# Patient Record
Sex: Male | Born: 1955 | Race: Black or African American | Hispanic: No | Marital: Single | Smoking: Current every day smoker
Health system: Southern US, Community
[De-identification: ages and names within clinical notes are randomized; demographics above are authoritative.]

## PROBLEM LIST (undated history)

## (undated) DIAGNOSIS — E119 Type 2 diabetes mellitus without complications: Secondary | ICD-10-CM

## (undated) DIAGNOSIS — I4891 Unspecified atrial fibrillation: Secondary | ICD-10-CM

## (undated) DIAGNOSIS — I10 Essential (primary) hypertension: Secondary | ICD-10-CM

## (undated) DIAGNOSIS — I219 Acute myocardial infarction, unspecified: Secondary | ICD-10-CM

## (undated) DIAGNOSIS — I639 Cerebral infarction, unspecified: Secondary | ICD-10-CM

---

## 2019-09-18 ENCOUNTER — Emergency Department (HOSPITAL_COMMUNITY): Payer: No Typology Code available for payment source

## 2019-09-18 ENCOUNTER — Observation Stay (HOSPITAL_COMMUNITY)
Admission: EM | Admit: 2019-09-18 | Discharge: 2019-09-20 | Disposition: A | Discharge status: Home / Self Care | Payer: No Typology Code available for payment source | Attending: Internal Medicine | Admitting: Internal Medicine

## 2019-09-18 ENCOUNTER — Other Ambulatory Visit: Payer: Self-pay

## 2019-09-18 ENCOUNTER — Encounter (HOSPITAL_COMMUNITY): Payer: Self-pay

## 2019-09-18 DIAGNOSIS — I2510 Atherosclerotic heart disease of native coronary artery without angina pectoris: Secondary | ICD-10-CM | POA: Diagnosis not present

## 2019-09-18 DIAGNOSIS — R079 Chest pain, unspecified: Principal | ICD-10-CM | POA: Diagnosis present

## 2019-09-18 DIAGNOSIS — G8929 Other chronic pain: Secondary | ICD-10-CM | POA: Insufficient documentation

## 2019-09-18 DIAGNOSIS — R0602 Shortness of breath: Secondary | ICD-10-CM

## 2019-09-18 DIAGNOSIS — R0902 Hypoxemia: Secondary | ICD-10-CM | POA: Insufficient documentation

## 2019-09-18 DIAGNOSIS — Z20822 Contact with and (suspected) exposure to covid-19: Secondary | ICD-10-CM | POA: Diagnosis not present

## 2019-09-18 DIAGNOSIS — M549 Dorsalgia, unspecified: Secondary | ICD-10-CM | POA: Insufficient documentation

## 2019-09-18 DIAGNOSIS — D696 Thrombocytopenia, unspecified: Secondary | ICD-10-CM | POA: Diagnosis not present

## 2019-09-18 DIAGNOSIS — E119 Type 2 diabetes mellitus without complications: Secondary | ICD-10-CM | POA: Insufficient documentation

## 2019-09-18 DIAGNOSIS — R400 Somnolence: Secondary | ICD-10-CM | POA: Diagnosis not present

## 2019-09-18 DIAGNOSIS — I252 Old myocardial infarction: Secondary | ICD-10-CM | POA: Diagnosis not present

## 2019-09-18 DIAGNOSIS — I4891 Unspecified atrial fibrillation: Secondary | ICD-10-CM

## 2019-09-18 DIAGNOSIS — E669 Obesity, unspecified: Secondary | ICD-10-CM | POA: Insufficient documentation

## 2019-09-18 DIAGNOSIS — Z6835 Body mass index (BMI) 35.0-35.9, adult: Secondary | ICD-10-CM | POA: Insufficient documentation

## 2019-09-18 DIAGNOSIS — I639 Cerebral infarction, unspecified: Secondary | ICD-10-CM

## 2019-09-18 DIAGNOSIS — Z794 Long term (current) use of insulin: Secondary | ICD-10-CM

## 2019-09-18 DIAGNOSIS — F17210 Nicotine dependence, cigarettes, uncomplicated: Secondary | ICD-10-CM | POA: Diagnosis not present

## 2019-09-18 DIAGNOSIS — I69320 Aphasia following cerebral infarction: Secondary | ICD-10-CM | POA: Insufficient documentation

## 2019-09-18 DIAGNOSIS — D509 Iron deficiency anemia, unspecified: Secondary | ICD-10-CM | POA: Diagnosis not present

## 2019-09-18 DIAGNOSIS — I480 Paroxysmal atrial fibrillation: Secondary | ICD-10-CM | POA: Diagnosis not present

## 2019-09-18 DIAGNOSIS — I10 Essential (primary) hypertension: Secondary | ICD-10-CM | POA: Diagnosis not present

## 2019-09-18 DIAGNOSIS — R451 Restlessness and agitation: Secondary | ICD-10-CM | POA: Diagnosis not present

## 2019-09-18 HISTORY — DX: Type 2 diabetes mellitus without complications: E11.9

## 2019-09-18 HISTORY — DX: Acute myocardial infarction, unspecified: I21.9

## 2019-09-18 HISTORY — DX: Cerebral infarction, unspecified: I63.9

## 2019-09-18 HISTORY — DX: Essential (primary) hypertension: I10

## 2019-09-18 HISTORY — DX: Unspecified atrial fibrillation: I48.91

## 2019-09-18 LAB — CBC WITH DIFFERENTIAL/PLATELET
Abs Immature Granulocytes: 0.02 10*3/uL (ref 0.00–0.07)
Basophils Absolute: 0 10*3/uL (ref 0.0–0.1)
Basophils Relative: 0 %
Eosinophils Absolute: 0.3 10*3/uL (ref 0.0–0.5)
Eosinophils Relative: 3 %
HCT: 31 % — ABNORMAL LOW (ref 39.0–52.0)
Hemoglobin: 10.7 g/dL — ABNORMAL LOW (ref 13.0–17.0)
Immature Granulocytes: 0 %
Lymphocytes Relative: 22 %
Lymphs Abs: 2.2 10*3/uL (ref 0.7–4.0)
MCH: 24.2 pg — ABNORMAL LOW (ref 26.0–34.0)
MCHC: 34.5 g/dL (ref 30.0–36.0)
MCV: 70 fL — ABNORMAL LOW (ref 80.0–100.0)
Monocytes Absolute: 0.7 10*3/uL (ref 0.1–1.0)
Monocytes Relative: 7 %
Neutro Abs: 6.9 10*3/uL (ref 1.7–7.7)
Neutrophils Relative %: 68 %
Platelets: 157 10*3/uL (ref 150–400)
RBC: 4.43 MIL/uL (ref 4.22–5.81)
RDW: 16.5 % — ABNORMAL HIGH (ref 11.5–15.5)
WBC: 10.1 10*3/uL (ref 4.0–10.5)
nRBC: 0.2 % (ref 0.0–0.2)

## 2019-09-18 NOTE — ED Triage Notes (Signed)
Pt states he started having chest "pressure" around 2130 today with diaphoresis and SOB. Pt states he had an MI last Thursday and it feels the same.

## 2019-09-18 NOTE — ED Provider Notes (Signed)
[facility] DEPT Provider Note: Lowella Dell, MD, FACEP  CSN: [id number] MRN: [id number] ARRIVAL: 09/18/19 at 2247 ROOM: 1401/1401-01   CHIEF COMPLAINT  Chest Pain   HISTORY OF PRESENT ILLNESS  09/18/19 11:13 PM Eric Forbes is a 64 y.o. male who states that 9 days ago he had a heart attack and was seen in an ED in [facility], he also states he was seen at [facility] in [place].  [No records of a visit by this patient could be found using Epic outride record search.]  He characterizes the event as consisting of the sensation of an elephant sitting on his chest, shortness of breath and diaphoresis but no nausea.  He states a cardiac catheterization was performed through his right radial artery but no stenting was done.  He is here with similar symptoms that occurred earlier this evening but he cannot say exactly when.  He again had chest pain that has now moved down to his lower abdomen.  He had shortness of breath that has resolved he had diaphoresis that has resolved.  He did not have nausea.  He currently rates his discomfort as a 5 out of 10 but is reticent to call in an actual pain at the present time.  He states earlier he was afraid to take a deep breath for fear of dying.  The patient had a stroke about a year ago due to atrial fibrillation (he is now status post ablation) and has a resulting mild expressive aphasia.  He is also on Dayvigo currently.  His nurse reports his oxygen saturation was dropping into the 70s so she placed him on oxygen and his oxygen saturation is now 99%.   Past Medical History:  Diagnosis Date  . Afib (HCC)   . Diabetes mellitus without complication (HCC)   . Hypertension   . Myocardial infarction (HCC)   . Stroke [facility])     History reviewed. No pertinent surgical history.  History reviewed. No pertinent family history.  Social History   Tobacco Use  . Smoking status: Current Every Day Smoker   Packs/day: 0.50    Types: Cigarettes  . Smokeless tobacco: Never Used  Substance Use Topics  . Alcohol use: Yes    Comment: socially  . Drug use: Never    Prior to Admission medications   Not on File    Allergies Patient has no known allergies.   REVIEW OF SYSTEMS  Negative except as noted here or in the History of Present Illness.   PHYSICAL EXAMINATION  Initial Vital Signs Blood pressure (!) 144/84, pulse 79, temperature 98 F (36.7 C), resp. rate 16, height 5' 9.5" (1.765 m), weight 108.9 kg, SpO2 95 %.  Examination General: Well-developed, well-nourished male in no acute distress; appearance consistent with age of record HENT: normocephalic; atraumatic Eyes: pupils equal, round and reactive to light; extraocular muscles intact Neck: supple Heart: regular rate and rhythm Lungs: clear to auscultation bilaterally Abdomen: soft; nondistended; mild diffuse tenderness; no masses or hepatosplenomegaly; bowel sounds present Extremities: No deformity; full range of motion; pulses normal Neurologic: Awake, alert and oriented; motor function intact in all extremities and symmetric; no facial droop; occasional stammer; one episode of what appeared to be a narcoleptic episode midsentence, readily aroused Skin: Warm and dry; healing puncture wound overlying right radial artery at the wrist Psychiatric: Normal mood and affect   RESULTS  Summary of this visit's results, reviewed and interpreted by myself:  EKG Interpretation:  Date &  Time: 09/18/2019 10:54 PM  Rate: 76  Rhythm: normal sinus rhythm  QRS Axis: normal  Intervals: normal  ST/T Wave abnormalities: normal  Conduction Disutrbances:none  Narrative Interpretation:   Old EKG Reviewed: none available  Laboratory Studies: Results for orders placed or performed during the hospital encounter of 09/18/19 (from the past 24 hour(s))  CBC with Differential/Platelet     Status: Abnormal   Collection Time: 09/18/19 11:33 PM    Result Value Ref Range   WBC 10.1 4.0 - 10.5 K/uL   RBC 4.43 4.22 - 5.81 MIL/uL   Hemoglobin 10.7 (L) 13.0 - 17.0 g/dL   HCT 31.0 (L) 39.0 - 52.0 %   MCV 70.0 (L) 80.0 - 100.0 fL   MCH 24.2 (L) 26.0 - 34.0 pg   MCHC 34.5 30.0 - 36.0 g/dL   RDW 16.5 (H) 11.5 - 15.5 %   Platelets 157 150 - 400 K/uL   nRBC 0.2 0.0 - 0.2 %   Neutrophils Relative % 68 %   Neutro Abs 6.9 1.7 - 7.7 K/uL   Lymphocytes Relative 22 %   Lymphs Abs 2.2 0.7 - 4.0 K/uL   Monocytes Relative 7 %   Monocytes Absolute 0.7 0.1 - 1.0 K/uL   Eosinophils Relative 3 %   Eosinophils Absolute 0.3 0.0 - 0.5 K/uL   Basophils Relative 0 %   Basophils Absolute 0.0 0.0 - 0.1 K/uL   Immature Granulocytes 0 %   Abs Immature Granulocytes 0.02 0.00 - 0.07 K/uL  Basic metabolic panel     Status: None   Collection Time: 09/18/19 11:33 PM  Result Value Ref Range   Sodium 139 135 - 145 mmol/L   Potassium 4.2 3.5 - 5.1 mmol/L   Chloride 98 98 - 111 mmol/L   CO2 31 22 - 32 mmol/L   Glucose, Bld 98 70 - 99 mg/dL   BUN 23 8 - 23 mg/dL   Creatinine, Ser 1.04 0.61 - 1.24 mg/dL   Calcium 9.8 8.9 - 10.3 mg/dL   GFR calc non Af Amer >60 >60 mL/min   GFR calc Af Amer >60 >60 mL/min   Anion gap 10 5 - 15  Troponin I (High Sensitivity)     Status: None   Collection Time: 09/18/19 11:33 PM  Result Value Ref Range   Troponin I (High Sensitivity) 5 <18 ng/L  Troponin I (High Sensitivity)     Status: None   Collection Time: 09/19/19  1:05 AM  Result Value Ref Range   Troponin I (High Sensitivity) 6 <18 ng/L  Respiratory Panel by RT PCR (Flu A&B, Covid) - Nasopharyngeal Swab     Status: None   Collection Time: 09/19/19  1:47 AM   Specimen: Nasopharyngeal Swab  Result Value Ref Range   SARS Coronavirus 2 by RT PCR NEGATIVE NEGATIVE   Influenza A by PCR NEGATIVE NEGATIVE   Influenza B by PCR NEGATIVE NEGATIVE  Iron and TIBC     Status: None   Collection Time: 09/19/19  4:46 AM  Result Value Ref Range   Iron 87 45 - 182 ug/dL   TIBC  377 250 - 450 ug/dL   Saturation Ratios 23 17.9 - 39.5 %   UIBC 290 ug/dL  Ferritin     Status: None   Collection Time: 09/19/19  4:46 AM  Result Value Ref Range   Ferritin 172 24 - 336 ng/mL  D-dimer, quantitative (not at [facility])     Status: None   Collection Time: 09/19/19  4:46 AM  Result Value Ref Range   D-Dimer, Quant <0.27 0.00 - 0.50 ug/mL-FEU   Imaging Studies: DG Chest 2 View  Result Date: 09/18/2019 CLINICAL DATA:  Chest pressure and shortness of breath. EXAM: CHEST - 2 VIEW COMPARISON:  None. FINDINGS: The heart size and mediastinal contours are within normal limits. Both lungs are clear. The visualized skeletal structures are unremarkable. IMPRESSION: No active cardiopulmonary disease. Electronically Signed   By: Aram Candela M.D.   On: 09/18/2019 23:52    ED COURSE and MDM  Nursing notes, initial and subsequent vitals signs, including pulse oximetry, reviewed and interpreted by myself.  Vitals:   09/19/19 0130 09/19/19 0200 09/19/19 0230 09/19/19 0308  BP:  (!) 130/91  106/76  Pulse: 73 81 82 69  Resp: 14 18 20 18   Temp:    98.1 F (36.7 C)  TempSrc:    Oral  SpO2: 97% 94% 98% 100%  Weight:    108.9 kg  Height:    5\' 9"  (1.753 m)   Medications  acetaminophen (TYLENOL) tablet 650 mg (has no administration in time range)  nicotine (NICODERM CQ - dosed in mg/24 hours) patch 21 mg (has no administration in time range)  insulin aspart (novoLOG) injection 0-9 Units (has no administration in time range)  insulin aspart (novoLOG) injection 0-5 Units (has no administration in time range)  HYDROcodone-acetaminophen (NORCO) 10-325 MG per tablet 1 tablet (1 tablet Oral Given 09/19/19 0355)    1:45 AM Patient denies chest pain at this time.  Troponins negative.  Hospitalist to admit.  Unable to obtain records regarding patient's reported recent hospitalization for MI.  PROCEDURES  Procedures   ED DIAGNOSES     ICD-10-CM   1. Chest pain at rest  R07.9          , MD 09/19/19 [phone]

## 2019-09-19 ENCOUNTER — Other Ambulatory Visit: Payer: Self-pay

## 2019-09-19 ENCOUNTER — Encounter (HOSPITAL_COMMUNITY): Payer: Self-pay | Admitting: Internal Medicine

## 2019-09-19 ENCOUNTER — Observation Stay (HOSPITAL_BASED_OUTPATIENT_CLINIC_OR_DEPARTMENT_OTHER): Payer: No Typology Code available for payment source

## 2019-09-19 DIAGNOSIS — I639 Cerebral infarction, unspecified: Secondary | ICD-10-CM | POA: Diagnosis not present

## 2019-09-19 DIAGNOSIS — I480 Paroxysmal atrial fibrillation: Secondary | ICD-10-CM | POA: Diagnosis not present

## 2019-09-19 DIAGNOSIS — Z720 Tobacco use: Secondary | ICD-10-CM

## 2019-09-19 DIAGNOSIS — R079 Chest pain, unspecified: Secondary | ICD-10-CM

## 2019-09-19 DIAGNOSIS — I10 Essential (primary) hypertension: Secondary | ICD-10-CM | POA: Diagnosis not present

## 2019-09-19 DIAGNOSIS — R451 Restlessness and agitation: Secondary | ICD-10-CM

## 2019-09-19 DIAGNOSIS — D509 Iron deficiency anemia, unspecified: Secondary | ICD-10-CM | POA: Diagnosis not present

## 2019-09-19 DIAGNOSIS — I2510 Atherosclerotic heart disease of native coronary artery without angina pectoris: Secondary | ICD-10-CM

## 2019-09-19 DIAGNOSIS — G8929 Other chronic pain: Secondary | ICD-10-CM | POA: Diagnosis not present

## 2019-09-19 DIAGNOSIS — R400 Somnolence: Secondary | ICD-10-CM

## 2019-09-19 DIAGNOSIS — E119 Type 2 diabetes mellitus without complications: Secondary | ICD-10-CM | POA: Diagnosis not present

## 2019-09-19 DIAGNOSIS — I4891 Unspecified atrial fibrillation: Secondary | ICD-10-CM

## 2019-09-19 LAB — CBC WITH DIFFERENTIAL/PLATELET
Abs Immature Granulocytes: 0.01 10*3/uL (ref 0.00–0.07)
Basophils Absolute: 0 10*3/uL (ref 0.0–0.1)
Basophils Relative: 0 %
Eosinophils Absolute: 0.3 10*3/uL (ref 0.0–0.5)
Eosinophils Relative: 3 %
HCT: 29.9 % — ABNORMAL LOW (ref 39.0–52.0)
Hemoglobin: 10.4 g/dL — ABNORMAL LOW (ref 13.0–17.0)
Immature Granulocytes: 0 %
Lymphocytes Relative: 22 %
Lymphs Abs: 1.6 10*3/uL (ref 0.7–4.0)
MCH: 24.2 pg — ABNORMAL LOW (ref 26.0–34.0)
MCHC: 34.8 g/dL (ref 30.0–36.0)
MCV: 69.7 fL — ABNORMAL LOW (ref 80.0–100.0)
Monocytes Absolute: 0.6 10*3/uL (ref 0.1–1.0)
Monocytes Relative: 9 %
Neutro Abs: 5 10*3/uL (ref 1.7–7.7)
Neutrophils Relative %: 66 %
Platelets: 134 10*3/uL — ABNORMAL LOW (ref 150–400)
RBC: 4.29 MIL/uL (ref 4.22–5.81)
RDW: 16.5 % — ABNORMAL HIGH (ref 11.5–15.5)
WBC: 7.5 10*3/uL (ref 4.0–10.5)
nRBC: 0.4 % — ABNORMAL HIGH (ref 0.0–0.2)

## 2019-09-19 LAB — BASIC METABOLIC PANEL
Anion gap: 10 (ref 5–15)
BUN: 23 mg/dL (ref 8–23)
CO2: 31 mmol/L (ref 22–32)
Calcium: 9.8 mg/dL (ref 8.9–10.3)
Chloride: 98 mmol/L (ref 98–111)
Creatinine, Ser: 1.04 mg/dL (ref 0.61–1.24)
GFR calc Af Amer: >60 mL/min (ref >60)
GFR calc non Af Amer: >60 mL/min (ref >60)
Glucose, Bld: 98 mg/dL (ref 70–99)
Potassium: 4.2 mmol/L (ref 3.5–5.1)
Sodium: 139 mmol/L (ref 135–145)

## 2019-09-19 LAB — BLOOD GAS, ARTERIAL
Acid-Base Excess: 7.3 mmol/L — ABNORMAL HIGH (ref 0.0–2.0)
Bicarbonate: 32.5 mmol/L — ABNORMAL HIGH (ref 20.0–28.0)
FIO2: 21
O2 Saturation: 92.4 %
Patient temperature: 98.6
pCO2 arterial: 51.4 mmHg — ABNORMAL HIGH (ref 32.0–48.0)
pH, Arterial: 7.417 (ref 7.350–7.450)
pO2, Arterial: 71.6 mmHg — ABNORMAL LOW (ref 83.0–108.0)

## 2019-09-19 LAB — COMPREHENSIVE METABOLIC PANEL
ALT: 17 U/L (ref 0–44)
AST: 19 U/L (ref 15–41)
Albumin: 3.8 g/dL (ref 3.5–5.0)
Alkaline Phosphatase: 72 U/L (ref 38–126)
Anion gap: 7 (ref 5–15)
BUN: 21 mg/dL (ref 8–23)
CO2: 31 mmol/L (ref 22–32)
Calcium: 9 mg/dL (ref 8.9–10.3)
Chloride: 103 mmol/L (ref 98–111)
Creatinine, Ser: 0.74 mg/dL (ref 0.61–1.24)
GFR calc Af Amer: >60 mL/min (ref >60)
GFR calc non Af Amer: >60 mL/min (ref >60)
Glucose, Bld: 120 mg/dL — ABNORMAL HIGH (ref 70–99)
Potassium: 4.2 mmol/L (ref 3.5–5.1)
Sodium: 141 mmol/L (ref 135–145)
Total Bilirubin: 0.7 mg/dL (ref 0.3–1.2)
Total Protein: 6.8 g/dL (ref 6.5–8.1)

## 2019-09-19 LAB — GLUCOSE, CAPILLARY
Glucose-Capillary: 114 mg/dL — ABNORMAL HIGH (ref 70–99)
Glucose-Capillary: 242 mg/dL — ABNORMAL HIGH (ref 70–99)
Glucose-Capillary: 190 mg/dL — ABNORMAL HIGH (ref 70–99)
Glucose-Capillary: 217 mg/dL — ABNORMAL HIGH (ref 70–99)

## 2019-09-19 LAB — MAGNESIUM: Magnesium: 2.1 mg/dL (ref 1.7–2.4)

## 2019-09-19 LAB — IRON AND TIBC
Iron: 87 ug/dL (ref 45–182)
Saturation Ratios: 23 % (ref 17.9–39.5)
TIBC: 377 ug/dL (ref 250–450)
UIBC: 290 ug/dL

## 2019-09-19 LAB — RESPIRATORY PANEL BY RT PCR (FLU A&B, COVID)
Influenza A by PCR: NEGATIVE
Influenza B by PCR: NEGATIVE
SARS Coronavirus 2 by RT PCR: NEGATIVE

## 2019-09-19 LAB — RAPID URINE DRUG SCREEN, HOSP PERFORMED
Amphetamines: NOT DETECTED
Barbiturates: NOT DETECTED
Benzodiazepines: NOT DETECTED
Cocaine: NOT DETECTED
Opiates: POSITIVE — AB
Tetrahydrocannabinol: NOT DETECTED

## 2019-09-19 LAB — TROPONIN I (HIGH SENSITIVITY)
Troponin I (High Sensitivity): 5 ng/L (ref <18)
Troponin I (High Sensitivity): 6 ng/L (ref <18)

## 2019-09-19 LAB — TSH: TSH: 0.766 u[IU]/mL (ref 0.350–4.500)

## 2019-09-19 LAB — PHOSPHORUS: Phosphorus: 4.3 mg/dL (ref 2.5–4.6)

## 2019-09-19 LAB — FERRITIN: Ferritin: 172 ng/mL (ref 24–336)

## 2019-09-19 LAB — HIV ANTIBODY (ROUTINE TESTING W REFLEX): HIV Screen 4th Generation wRfx: NONREACTIVE

## 2019-09-19 LAB — ECHOCARDIOGRAM COMPLETE
Height: 69 in
Weight: 3840 oz

## 2019-09-19 LAB — D-DIMER, QUANTITATIVE: D-Dimer, Quant: <0.27 ug/mL-FEU (ref 0.00–0.50)

## 2019-09-19 MED ORDER — HYDROCODONE-ACETAMINOPHEN 10-325 MG PO TABS
1.0000 | ORAL_TABLET | Freq: Four times a day (QID) | ORAL | Status: DC | PRN
  Administered 2019-09-19 – 2019-09-20 (×4): 1 via ORAL
  Filled 2019-09-19 (×4): qty 1

## 2019-09-19 MED ORDER — INSULIN ASPART 100 UNIT/ML ~~LOC~~ SOLN
0.0000 [IU] | Freq: Three times a day (TID) | SUBCUTANEOUS | Status: DC
  Administered 2019-09-19: 2 [IU] via SUBCUTANEOUS
  Administered 2019-09-19: 3 [IU] via SUBCUTANEOUS
  Administered 2019-09-20: 2 [IU] via SUBCUTANEOUS

## 2019-09-19 MED ORDER — NITROGLYCERIN 0.4 MG SL SUBL: 0.4000 mg | SUBLINGUAL_TABLET | SUBLINGUAL | Status: DC | PRN

## 2019-09-19 MED ORDER — ENOXAPARIN SODIUM 40 MG/0.4ML ~~LOC~~ SOLN: 40.0000 mg | SUBCUTANEOUS | Status: DC

## 2019-09-19 MED ORDER — HYDROCODONE-ACETAMINOPHEN 5-325 MG PO TABS: 1.0000 | ORAL_TABLET | Freq: Four times a day (QID) | ORAL | Status: DC | PRN

## 2019-09-19 MED ORDER — NITROGLYCERIN 0.4 MG SL SUBL: 0.4000 mg | SUBLINGUAL_TABLET | SUBLINGUAL | 0 refills | Status: DC | PRN

## 2019-09-19 MED ORDER — ACETAMINOPHEN 325 MG PO TABS
650.0000 mg | ORAL_TABLET | ORAL | Status: DC | PRN
  Administered 2019-09-19: 650 mg via ORAL
  Filled 2019-09-19: qty 2

## 2019-09-19 MED ORDER — APIXABAN 5 MG PO TABS
5.0000 mg | ORAL_TABLET | Freq: Two times a day (BID) | ORAL | Status: DC
  Administered 2019-09-19 – 2019-09-20 (×2): 5 mg via ORAL
  Filled 2019-09-19: qty 2
  Filled 2019-09-19: qty 1

## 2019-09-19 MED ORDER — ISOSORBIDE MONONITRATE ER 30 MG PO TB24: 30.0000 mg | ORAL_TABLET | Freq: Every day | ORAL | 0 refills | Status: DC

## 2019-09-19 MED ORDER — NICOTINE 21 MG/24HR TD PT24
21.0000 mg | MEDICATED_PATCH | Freq: Every day | TRANSDERMAL | Status: DC
  Administered 2019-09-19 – 2019-09-20 (×2): 21 mg via TRANSDERMAL
  Filled 2019-09-19 (×2): qty 1

## 2019-09-19 MED ORDER — INSULIN ASPART 100 UNIT/ML ~~LOC~~ SOLN
0.0000 [IU] | Freq: Every day | SUBCUTANEOUS | Status: DC
  Administered 2019-09-19: 2 [IU] via SUBCUTANEOUS

## 2019-09-19 MED ORDER — MELATONIN 5 MG PO TABS
10.0000 mg | ORAL_TABLET | Freq: Once | ORAL | Status: AC
  Administered 2019-09-19: 10 mg via ORAL
  Filled 2019-09-19: qty 2

## 2019-09-19 MED ORDER — FUROSEMIDE 10 MG/ML IJ SOLN
40.0000 mg | Freq: Once | INTRAMUSCULAR | Status: AC
  Administered 2019-09-19: 40 mg via INTRAVENOUS
  Filled 2019-09-19: qty 4

## 2019-09-19 MED ORDER — ISOSORBIDE MONONITRATE ER 30 MG PO TB24
30.0000 mg | ORAL_TABLET | Freq: Every day | ORAL | Status: DC
  Administered 2019-09-19 – 2019-09-20 (×2): 30 mg via ORAL
  Filled 2019-09-19 (×2): qty 1

## 2019-09-19 MED ORDER — ATORVASTATIN CALCIUM 40 MG PO TABS: 40.0000 mg | ORAL_TABLET | Freq: Every day | ORAL | 0 refills | Status: DC

## 2019-09-19 MED ORDER — NICOTINE 21 MG/24HR TD PT24: 21.0000 mg | MEDICATED_PATCH | Freq: Every day | TRANSDERMAL | 0 refills | Status: DC

## 2019-09-19 MED ORDER — ASPIRIN 81 MG PO CHEW
81.0000 mg | CHEWABLE_TABLET | Freq: Every day | ORAL | Status: DC
  Administered 2019-09-19 – 2019-09-20 (×2): 81 mg via ORAL
  Filled 2019-09-19 (×2): qty 1

## 2019-09-19 MED ORDER — ATORVASTATIN CALCIUM 40 MG PO TABS
40.0000 mg | ORAL_TABLET | Freq: Every day | ORAL | Status: DC
  Administered 2019-09-19: 40 mg via ORAL
  Filled 2019-09-19: qty 1

## 2019-09-19 MED ORDER — HYDROCODONE-ACETAMINOPHEN 10-325 MG PO TABS
1.0000 | ORAL_TABLET | Freq: Once | ORAL | Status: AC
  Administered 2019-09-19: 1 via ORAL
  Filled 2019-09-19: qty 1

## 2019-09-19 NOTE — Progress Notes (Signed)
Care started prior to midnight in the emergency room and patient was admitted early this morning after midnight by my partner colleague Dr. John Giovanni and I am in current agreement with her assessment and plan.  Additional changes to the plan of care been made accordingly.  The patient is a 64 year old obese African-American male with a past medical history significant for but not limited to atrial fibrillation, diabetes mellitus type 2, hypertension, history of CVA, history of tobacco abuse as well as a recent MI who presents with chief complaint of chest pain.  Patient had an MI on 09/09/2018 when he was seen at [facility] in [place].  He was transferred to [facility] in [place] and underwent a cardiac catheterization but no stent was placed as the culprit vessel was too small for stent placement reportedly.  Medical management was recommended at that time and he is given aspirin, Eliquis, metoprolol, lisinopril, furosemide.  At that time his flecainide was stopped and started on amlodipine.  Yesterday afternoon around 3:57 PM he started experience chest pain again which was similar to what he had the time of his heart attack.  He did not remember what he was doing and associated symptoms were with dyspnea and diaphoresis.  The pain radiated down his right arm and lasted for several hours and finally resolved prior to arrival in the ED.  Currently he is chest pain-free but he does have chronic pain which he takes Vicodin as well as Levophanol.  Initial evaluation in the ED showed that his high-sensitivity troponins were negative and EKG is not suggestive of ACS.  Chest x-ray showed no acute cardiopulmonary disease.  He was admitted for his chest pain and of note he was found to be hypoxic intermittently with saturations in the 70s while he is at the triage.  He also complained of some shortness of breath and saturating well on 2 L supplemental oxygen via  nasal cannula.  Cardiology is consulted for his chest pain and they recommended adding Imdur and nitroglycerin.  Cardiology evaluated his echocardiogram which showed a normal EF.  Cardiology cleared the patient for discharge and patient will be discharged later today however became more somnolent and extremely agitated and complained of significant diuresis.  Currently he has been admitted for and being treated for the following but not limited to:  Chest Pain -Patient reports history of recent MI on 09/09/2019 for which he was seen at [facility] in [facility] and then transferred to [place].   -Reports undergoing cardiac catheterization and being told that the culprit vessel was too small for stenting.   -Per patient, medical management was recommended.   -No records from outside hospitals available at this time.  Presenting with symptoms concerning for angina.   -However, high-sensitivity troponin negative x2 and EKG without acute ischemic changes.  Currently chest pain-free and appears comfortable. -Continue cardiac monitoring.   -EKG as needed for recurrence of chest pain.   -Echocardiogram ordered and normal EF. -Cardiology was consulted and they recommended medical management and place the patient on nitroglycerin as well as Imdur -D-dimer was less than 0.27 -They recommended following up in outpatient setting  Hypoxia: -Per patient's nurse, he was intermittently hypoxic to the 70s at triage and was complaining of shortness of breath.   -Since being in the ED room, satting well on 2 L supplemental oxygen and oxygen saturation has not dropped.   -Lungs  clear on exam but diminished.  No increased work of breathing.   -Chest x-ray showing no active cardiopulmonary disease. -Give a dose of IV Lasix 40 mg;  -Continuous pulse ox.  Continue supplemental oxygen at this time, wean as tolerated.   -Ordered an ABG as the patient was more  somnolent and showed pH of 7.417, PCO2 of 81.4, PO2 of 71.6, ABG O2 saturation of 92.4% and a bicarbonate level of 32.5 -Patient became extremely somnolent and then extremely agitated after -Continue supplemental oxygen via nasal cannula and weaned to room air -Continuous pulse oximetry and maintain O2 saturations greater than 92% -We will order CPAP for the night -Check chest x-ray in the a.m.  CAD -Reported by patient -Resume aspirin, statin, metoprolol and lisinopril; resume the beta-blocker and ACE inhibitor once the dosing is known -Continue with nitroglycerin and Imdur started by cardiology  History of CVA -Continue Eliquis and aspirin as well as statin and aspirin  A. Fib -Currently in sinus rhythm.  Resume Eliquis and metoprolol after pharmacy med rec is done and dosing of known.   -Per patient, flecainide was stopped during his recent hospitalization.  Microcytic anemia -Hemoglobin 10.7 and MCV 70.  No prior labs for comparison.   -Not endorsing any symptoms of GI bleed.  Unclear if he has had a colonoscopy done previously. -An iron and anemia panel done and showed an iron level of 87, U IBC of 290, TIBC of 377, saturation ratios of 23%, ferritin level of 132 -Repeat hemoglobin/hematocrit was 10.4/29.9 -Continue to monitor for signs and symptoms of bleeding; currently no overt bleeding noted -Repeat CBC in a.m.  Thrombocytopenia  -Patient platelet count 134,000  -Continue to monitor for signs and symptoms of bleeding; no overt bleeding noted -Repeat CBC in a.m.  Tobacco Use -Patient reports smoking 1 pack of cigarettes daily for the past 50 years.   -NicoDerm patch and continue to counsel on smoking cessation.  Diabetes Mellitus -Unclear whether he is on insulin and/or oral meds but he states that he takes Lantus at night.   -Check A1c in the a.m.   -Sliding scale insulin sensitive ACHS and CBG checks.  Chronic Back Pain -Patient reports taking Vicodin 10 mg  as needed at home and is requesting a dose of his pain medication.   -Pharmacy med rec currently pending.  Will order a dose of Vicodin at this time.  Somnolence and then extreme agitation -Likely in the setting of his hypercarbia and suspected obstructive sleep apnea/OHS -Is extremely somnolent this morning then became extremely agitated but his ABG and stated that he felt diaphoretic and he was not actually diaphoretic -ABG as above -Check UDS and if necessary will place on BiPAP  Obesity -Estimated body mass index is 35.44 kg/m as calculated from the following:   Height as of this encounter: 5\' 9"  (1.753 m).   Weight as of this encounter: 108.9 kg. -Weight Loss and Dietary Counseling given  Suspected COPD with concomitant OSA/OHS -We will need to ambulatory home O2 screen prior to discharge and will need outpatient sleep study -We will trial CPAP tonight -If necessary will place the patient on breathing treatments   It was good to be discharged today however he had this episode where he became extremely somnolent; we will observe overnight and observe for any more diaphoresis in the type of feeling that he had after he had his ABG done.  Patient became extremely upset.  We will need to watch him carefully and check a  UDS and a TSH.  We will continue to monitor patient's clinical sponsor intervention and repeat blood work in the a.m.

## 2019-09-19 NOTE — ED Notes (Signed)
Pt lying in bed, eye's closed, chest rising and falling. Awakens easily. Pt state she feels better.  Will continue to monitor.

## 2019-09-19 NOTE — Consult Note (Signed)
Cardiology Consultation:   Patient ID: Eric Forbes MRN: [id number]; DOB: 01-16-1956  Admit date: 09/18/2019 Date of Consult: 09/19/2019  Primary Care Provider: No primary care provider on file. Primary Cardiologist: Cheyenne Bordeaux  Primary Electrophysiologist:  None    Patient Profile:   Eric Forbes is a 64 y.o. male with a hx of  CAD, atrial fibrillation, diabetes mellitus, hypertension, CVA, tobacco use who is being seen today for the evaluation of chest discomfort and atrial fibrillation at the request of DrSheikh.  History of Present Illness:   Eric Forbes is a 64 year old gentleman with a history of atrial ablation, diabetes, hypertension, CVA and tobacco abuse.  He states that he had a heart attack several weeks ago and was seen at [facility] in [place].  He was transferred to [facility] in [place].  He had a heart catheterization.  The culprit vessel was too small for stenting.  The patient was managed medically.  Following that hospitalization his flecainide was discontinued because of the diagnosis of coronary artery disease.  He presented with an unusual episode of feeling hot and flushed with chest fullness.  The episode lasted for several minutes.  It resolved spontaneously.  He did not have any nitroglycerin to take.  I do not have his medication list.  He thinks he was started on amlodipine at the other hospital.  He also sees doctors at the [facility].  Currently lives in [place].  He will be moving to [place] sometime later this year.  He smokes 1/2 pack cigarettes a day.  He does not follow any restrictions on his diet.  Echocardiogram performed this morning was personally reviewed. The left ventricular systolic function is normal.  There were no significant segmental wall motion abnormalities.  He has no significant valvular disease.  Troponin levels are negative.       Past Medical History:  Diagnosis Date  .  Afib (HCC)   . Diabetes mellitus without complication (HCC)   . Hypertension   . Myocardial infarction (HCC)   . Stroke [facility])     History reviewed. No pertinent surgical history.   Home Medications:  Prior to Admission medications   Not on File    Inpatient Medications: Scheduled Meds: . aspirin  81 mg Oral Daily  . insulin aspart  0-5 Units Subcutaneous QHS  . insulin aspart  0-9 Units Subcutaneous TID WC  . isosorbide mononitrate  30 mg Oral Daily  . nicotine  21 mg Transdermal Daily   Continuous Infusions:  PRN Meds: acetaminophen, nitroGLYCERIN  Allergies:    Allergies  Allergen Reactions  . Bee Venom Anaphylaxis    Social History:   Social History   Socioeconomic History  . Marital status: Single    Spouse name: Not on file  . Number of children: Not on file  . Years of education: Not on file  . Highest education level: Not on file  Occupational History  . Not on file  Tobacco Use  . Smoking status: Current Every Day Smoker    Packs/day: 0.50    Types: Cigarettes  . Smokeless tobacco: Never Used  Substance and Sexual Activity  . Alcohol use: Yes    Comment: socially  . Drug use: Never  . Sexual activity: Not on file  Other Topics Concern  . Not on file  Social History Narrative  . Not on file   Social Determinants of Health   Financial Resource Strain:   . Difficulty of Paying Living  Expenses:   Food Insecurity:   . Worried About Charity fundraiser in the Last Year:   . Arboriculturist in the Last Year:   Transportation Needs:   . Film/video editor (Medical):   Marland Kitchen Lack of Transportation (Non-Medical):   Physical Activity:   . Days of Exercise per Week:   . Minutes of Exercise per Session:   Stress:   . Feeling of Stress :   Social Connections:   . Frequency of Communication with Friends and Family:   . Frequency of Social Gatherings with Friends and Family:   . Attends Religious Services:   . Active Member of Clubs or  Organizations:   . Attends Archivist Meetings:   Marland Kitchen Marital Status:   Intimate Partner Violence:   . Fear of Current or Ex-Partner:   . Emotionally Abused:   Marland Kitchen Physically Abused:   . Sexually Abused:     Family History:   Family History  Problem Relation Age of Onset  . Hypertension Mother   . Hypertension Father      ROS:  Please see the history of present illness.   All other ROS reviewed and negative.     Physical Exam/Data:   Vitals:   09/19/19 0200 09/19/19 0230 09/19/19 0308 09/19/19 0751  BP: (!) 130/91  106/76 127/63  Pulse: 81 82 69 65  Resp: 18 20 18 16   Temp:   98.1 F (36.7 C) 97.9 F (36.6 C)  TempSrc:   Oral Oral  SpO2: 94% 98% 100% 100%  Weight:   108.9 kg   Height:   5\' 9"  (1.753 m)     Intake/Output Summary (Last 24 hours) at 09/19/2019 0852 Last data filed at 09/19/2019 0751 Gross per 24 hour  Intake 100 ml  Output 725 ml  Net -625 ml   Last 3 Weights 09/19/2019 09/18/2019  Weight (lbs) 240 lb 240 lb  Weight (kg) 108.863 kg 108.863 kg     Body mass index is 35.44 kg/m.  General:  Middle age male, moderately obese. ,  NAD  HEENT: normal Lymph: no adenopathy Neck: no JVD Endocrine:  No thryomegaly Vascular: No carotid bruits; FA pulses 2+ bilaterally without bruits  Cardiac:  normal S1, S2; RRR; no murmur   Lungs:  clear to auscultation bilaterally, no wheezing, rhonchi or rales  Abd: soft, nontender, no hepatomegaly  Ext: no edema Musculoskeletal:  No deformities, BUE and BLE strength normal and equal Skin: warm and dry  Neuro:  CNs 2-12 intact, no focal abnormalities noted Psych:  Normal affect   EKG:  The EKG was personally reviewed and demonstrates:  NSR , no ST or T wave changes  Telemetry:  Telemetry was personally reviewed and demonstrates:   nsr   Relevant CV Studies:   Laboratory Data:  High Sensitivity Troponin:   Recent Labs  Lab 09/18/19 2333 09/19/19 0105  TROPONINIHS 5 6     Chemistry Recent Labs  Lab  09/18/19 2333  NA 139  K 4.2  CL 98  CO2 31  GLUCOSE 98  BUN 23  CREATININE 1.04  CALCIUM 9.8  GFRNONAA >60  GFRAA >60  ANIONGAP 10    No results for input(s): PROT, ALBUMIN, AST, ALT, ALKPHOS, BILITOT in the last 168 hours. Hematology Recent Labs  Lab 09/18/19 2333  WBC 10.1  RBC 4.43  HGB 10.7*  HCT 31.0*  MCV 70.0*  MCH 24.2*  MCHC 34.5  RDW 16.5*  PLT 157  BNPNo results for input(s): BNP, PROBNP in the last 168 hours.  DDimer  Recent Labs  Lab 09/19/19 0446  DDIMER <0.27     Radiology/Studies:  DG Chest 2 View  Result Date: 09/18/2019 CLINICAL DATA:  Chest pressure and shortness of breath. EXAM: CHEST - 2 VIEW COMPARISON:  None. FINDINGS: The heart size and mediastinal contours are within normal limits. Both lungs are clear. The visualized skeletal structures are unremarkable. IMPRESSION: No active cardiopulmonary disease. Electronically Signed   By: Aram Candela M.D.   On: 09/18/2019 23:52     HEAR Score (for undifferentiated chest pain):  HEAR Score: 5    Assessment and Plan:   1. Coronary artery disease: The patient presented to the hospital in [place] and was found to have coronary artery disease.  He was found have a very small vessel that was too small to be stented.  I do not have his full med list at this time but it sounds like he was not discharged on nitroglycerin.  We will add isosorbide mononitrate 30 mg a day.  We will also add nitroglycerin for him to take on an as-needed basis.  We will also make sure that he is taking aspirin 81 mg a day.  He is very stable ,  troponins are negative. We will ambulate him in the halls. If he does well, I think he can be discharged today   2.  Paroxysmal atrial fibrillation: He is currently in sinus rhythm.  Continue Eliquis and metoprolol.  He was previously on flecainide but this was stopped following his recent hospitalization. I suspect that he has sleep apnea.  He will need a sleep study as an  outpatient.  He suggest that the [facility] probably will be able to do the sleep study.  3.  Microcytic anemia: He recently received an iron infusion.  4.  Tobacco abuse: He still smokes 1 pack of cigarettes.  I have strongly encouraged him to stop smoking.  4.  Diabetes mellitus: Further management per the medical team. He is on Lisinopril   [facility] will sign off.   Medication Recommendations:  Cont meds  Other recommendations (labs, testing, etc):   Follow up as an outpatient:  With Dr. Elease Hashimoto or APP in several weeks.   We will arrange for him to see [place] .   For questions or updates, please contact [facility] Please consult [email or web address] for contact info under     Signed, Kristeen Miss, MD  09/19/2019 8:52 AM

## 2019-09-19 NOTE — Progress Notes (Signed)
Echocardiogram 2D Echocardiogram has been performed.  Eric Forbes 09/19/2019, 8:22 AM

## 2019-09-19 NOTE — Progress Notes (Signed)
Notified Lab that ABG being sent for analysis. 

## 2019-09-19 NOTE — H&P (Addendum)
History and Physical    Eric Forbes HAL:[id number] DOB: 1955-05-18 DOA: 09/18/2019  PCP: No primary care provider on file. Patient coming from: Home  Chief Complaint: Chest pain  HPI: Eric Forbes is a 64 y.o. male with medical history significant of A. fib, diabetes, hypertension, CVA, tobacco use presenting with a chief complaint of chest pain.  Patient states he had a heart attack on Thursday, 09/09/2019 and was seen at [facility] in [place].  He was then transferred to [place] in [place].  Reports undergoing cardiac catheterization but states no stent was placed.  He was told the culprit vessel was too small for stent placement.  Medical management was recommended.  Patient states he does takes several heart medications including aspirin, Eliquis, metoprolol, lisinopril, and furosemide.  States during his recent hospitalization flecainide was stopped and he was started on amlodipine.  States yesterday 5/8 afternoon around 3-4 PM he started experiencing chest pain again and this was similar to what he had at the time of his heart attack.  He does not remember what he was doing at the time his chest pain started.  Chest pain was associated with dyspnea and diaphoresis.  It radiated down his right arm.  Symptoms lasted for several hours and finally resolved prior to his ED arrival here.  Currently chest pain-free and back to baseline.  Patient reports having chronic back pain for which he takes Vicodin 10 mg as needed at home and he is requesting a dose of his pain medication.  ED Course: High-sensitivity troponin negative x2.  EKG not suggestive of ACS. Chest x-ray showing no active cardiopulmonary disease.  Review of Systems:  All systems reviewed and apart from history of presenting illness, are negative.  Past Medical History:  Diagnosis Date  . Afib ([place])   . Diabetes mellitus without complication ([place])   . Hypertension   .  Myocardial infarction ([place])   . Stroke [facility])     History reviewed. No pertinent surgical history.   reports that he has been smoking cigarettes. He has been smoking about 0.50 packs per day. He has never used smokeless tobacco. He reports current alcohol use. He reports that he does not use drugs.  No Known Allergies  History reviewed. No pertinent family history.  Prior to Admission medications   Not on File    Physical Exam: Vitals:   09/19/19 0100 09/19/19 0130 09/19/19 0200 09/19/19 0230  BP: 122/73  (!) 130/91   Pulse: 67 73 81 82  Resp: 12 14 18 20   Temp:      SpO2: 99% 97% 94% 98%  Weight:      Height:        Physical Exam  Constitutional: He is oriented to person, place, and time. He appears well-developed and well-nourished. No distress.  HENT:  Head: Normocephalic.  Eyes: Right eye exhibits no discharge. Left eye exhibits no discharge.  Cardiovascular: Normal rate, regular rhythm and intact distal pulses.  Pulmonary/Chest: Effort normal and breath sounds normal. No respiratory distress. He has no wheezes. He has no rales.  Abdominal: Soft. Bowel sounds are normal. He exhibits no distension. There is no abdominal tenderness. There is no guarding.  Musculoskeletal:     Cervical back: Neck supple.     Comments: Trace bilateral lower extremity edema  Neurological: He is alert and oriented to person, place, and time.  Skin: Skin is warm and dry. He is not diaphoretic.    Labs  on Admission: I have personally reviewed following labs and imaging studies  CBC: Recent Labs  Lab 09/18/19 2333  WBC 10.1  NEUTROABS 6.9  HGB 10.7*  HCT 31.0*  MCV 70.0*  PLT 157   Basic Metabolic Panel: Recent Labs  Lab 09/18/19 2333  NA 139  K 4.2  CL 98  CO2 31  GLUCOSE 98  BUN 23  CREATININE 1.04  CALCIUM 9.8   GFR: Estimated Creatinine Clearance: 86.8 mL/min (by C-G formula based on SCr of 1.04 mg/dL). Liver Function Tests: No results for input(s): AST, ALT,  ALKPHOS, BILITOT, PROT, ALBUMIN in the last 168 hours. No results for input(s): LIPASE, AMYLASE in the last 168 hours. No results for input(s): AMMONIA in the last 168 hours. Coagulation Profile: No results for input(s): INR, PROTIME in the last 168 hours. Cardiac Enzymes: No results for input(s): CKTOTAL, CKMB, CKMBINDEX, TROPONINI in the last 168 hours. BNP (last 3 results) No results for input(s): PROBNP in the last 8760 hours. HbA1C: No results for input(s): HGBA1C in the last 72 hours. CBG: No results for input(s): GLUCAP in the last 168 hours. Lipid Profile: No results for input(s): CHOL, HDL, LDLCALC, TRIG, CHOLHDL, LDLDIRECT in the last 72 hours. Thyroid Function Tests: No results for input(s): TSH, T4TOTAL, FREET4, T3FREE, THYROIDAB in the last 72 hours. Anemia Panel: No results for input(s): VITAMINB12, FOLATE, FERRITIN, TIBC, IRON, RETICCTPCT in the last 72 hours. Urine analysis: No results found for: COLORURINE, APPEARANCEUR, LABSPEC, PHURINE, GLUCOSEU, HGBUR, BILIRUBINUR, KETONESUR, PROTEINUR, UROBILINOGEN, NITRITE, LEUKOCYTESUR  Radiological Exams on Admission: DG Chest 2 View  Result Date: 09/18/2019 CLINICAL DATA:  Chest pressure and shortness of breath. EXAM: CHEST - 2 VIEW COMPARISON:  None. FINDINGS: The heart size and mediastinal contours are within normal limits. Both lungs are clear. The visualized skeletal structures are unremarkable. IMPRESSION: No active cardiopulmonary disease. Electronically Signed   By: Aram Candela M.D.   On: 09/18/2019 23:52    EKG: Independently reviewed.  Sinus rhythm, no acute ischemic changes.  Assessment/Plan Principal Problem:   Chest pain Active Problems:   CAD (coronary artery disease)   CVA (cerebral vascular accident) (HCC)   A-fib (HCC)   Microcytic anemia   Chest pain: Patient reports history of recent MI on 09/09/2019 for which he was seen at [facility] and then transferred  to [place] in [place].  Reports undergoing cardiac catheterization and being told that the culprit vessel was too small for stenting.  Per patient, medical management was recommended.  No records from outside hospitals available at this time.  Presenting with symptoms concerning for angina.  However, high-sensitivity troponin negative x2 and EKG without acute ischemic changes.  Currently chest pain-free and appears comfortable. -Continue cardiac monitoring.  EKG as needed for recurrence of chest pain.  Echocardiogram ordered.  Please obtain records from outside hospitals in the morning and consult cardiology.  Hypoxia: Per patient's nurse, he was intermittently hypoxic to the 70s at triage and was complaining of shortness of breath.  Since being in the ED room, satting well on 2 L supplemental oxygen and oxygen saturation has not dropped.  Lungs clear on exam.  No increased work of breathing.  Chest x-ray showing no active cardiopulmonary disease. -Continuous pulse ox.  Continue supplemental oxygen at this time, wean as tolerated.  Order ABG if patient becomes hypoxic again.  PE felt to be less likely given no tachycardia or increased work of breathing, will check stat  D-dimer.  CAD: Reported by patient.  Resume home medications after pharmacy med rec is done.  Please obtain records from outside hospitals as mentioned above.  History of CVA: Resume home medications after pharmacy med rec is done.  A. fib: Currently in sinus rhythm.  Resume Eliquis and metoprolol after pharmacy med rec is done.  Per patient, flecainide was stopped during his recent hospitalization.  Microcytic anemia: Hemoglobin 10.7 and MCV 70.  No prior labs for comparison.  Not endorsing any symptoms of GI bleed.  Unclear if he has had a colonoscopy done previously. -Check iron, ferritin, TIBC, FOBT  Tobacco use: Patient reports smoking 1 pack of cigarettes daily for the past 50 years.  NicoDerm patch and  continue to counsel on smoking cessation.  Diabetes mellitus: Unclear whether he is on insulin and/or oral meds.  Check A1c.  Sliding scale insulin sensitive ACHS and CBG checks.  Chronic back pain: Patient reports taking Vicodin 10 mg as needed at home and is requesting a dose of his pain medication.  Pharmacy med rec currently pending.  Will order a dose of Vicodin at this time.  DVT prophylaxis: On full dose anticoagulation at home.  Resume Eliquis after pharmacy med rec is done. Code Status: Full code Family Communication: No family available at this time. Disposition Plan: Status is: Observation  The patient remains OBS appropriate and will d/c before 2 midnights.  Dispo: The patient is from: Home              Anticipated d/c is to: Home              Anticipated d/c date is: 1 day              Patient currently is not medically stable to d/c.  The medical decision making on this patient was of high complexity and the patient is at high risk for clinical deterioration, therefore this is a level 3 visit.  John Giovanni MD [facility]  If 7PM-7AM, please contact night-coverage [email or web address]  09/19/2019, 2:55 AM
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
## 2019-09-19 NOTE — ED Notes (Addendum)
Pt sitting up in bed with difficulty finding a comfortable position. Pt has complaints of SOB with frequent intermittent episodes of destats on the monitor. MD to see. Will continue to monitor.

## 2019-09-19 NOTE — Progress Notes (Signed)
Abg procedure explained to the patient. ABG successfully drawn from the right wrist without complication. Site pressure was held until bleeding was stopped.  Immediately following the procedure the patient stated he had a procedure in that arm. No arm band was on wrist indicating not to stick that arm. Pt states he had a cath a week ago. Blood was sent to lab for anaylysis.

## 2019-09-19 NOTE — Discharge Summary (Signed)
Physician Discharge Summary  Eric Forbes NTI:[id number] DOB: Dec 04, 1955 DOA: 09/18/2019  PCP: [facility]  Admit date: 09/18/2019 Discharge date: 09/19/2019  Admitted From: Home Disposition: Home  Recommendations for Outpatient Follow-up:  1. Follow up with PCP in 1-2 weeks 2. Follow up with Cardiology within 1 week and has an appointment on Friday 3. Follow up with Psychiatry and has an appointment this week 4. Please obtain CMP/CBC, Mag, Phos in one week 5. Please follow up on the following pending results:  Home Health: No Equipment/Devices: None    Discharge Condition: Stable CODE STATUS: FULL CODE  Diet recommendation: Heart Healthy Carb Modified Diet   Brief/Interim Summary: HPI per Dr. John Giovanni  [name] is a 64 y.o. male with medical history significant of A. fib, diabetes, hypertension, CVA, tobacco use presenting with a chief complaint of chest pain.  Patient states he had a heart attack on Thursday, 09/09/2019 and was seen at [facility] in [place].  He was then transferred to [place] in [place].  Reports undergoing cardiac catheterization but states no stent was placed.  He was told the culprit vessel was too small for stent placement.  Medical management was recommended.  Patient states he does takes several heart medications including aspirin, Eliquis, metoprolol, lisinopril, and furosemide.  States during his recent hospitalization flecainide was stopped and he was started on amlodipine.  States yesterday 5/8 afternoon around 3-4 PM he started experiencing chest pain again and this was similar to what he had at the time of his heart attack.  He does not remember what he was doing at the time his chest pain started.  Chest pain was associated with dyspnea and diaphoresis.  It radiated down his right arm.  Symptoms lasted for several hours and finally resolved prior to his ED arrival here.  Currently chest  pain-free and back to baseline.  Patient reports having chronic back pain for which he takes Vicodin 10 mg as needed at home and he is requesting a dose of his pain medication.  **Interim History He was to be discharged yesterday however he had an episode where he became extremely somnolent and when he woke up he had a panic attack and became very agitated and felt diaphoretic.  This happened after he had an ABG done and became extremely upset.  A UDS was checked as well as a TSH.  Apparently he has a psychiatry appointment in the outpatient setting.  He is improved and stable for discharge and he had walked and ambulated without issues and did not desaturate this morning.  He will need to follow-up with PCP, cardiology as well as psychiatry in outpatient setting now that he is stable for discharge.  Discharge Diagnoses:  Principal Problem:   Chest pain Active Problems:   CAD (coronary artery disease)   CVA (cerebral vascular accident) (HCC)   A-fib (HCC)   Microcytic anemia  Chest Pain, improved -Patient reports history of recent MI on 09/09/2019 for which he was seen at [facility] in [facility] and then transferred to [place] in [place].  -Reports undergoing cardiac catheterization and being told that the culprit vessel was too small for stenting.  -Per patient, medical management was recommended.  -No records from outside hospitals available at this time. Presenting with symptoms concerning for angina.  -However, high-sensitivity troponin negative x2 and EKG without acute ischemic changes.Currently chest pain-free and appears comfortable. -Continue cardiac monitoring.  -  EKG as needed for recurrence of chest pain.  -Echocardiogram ordered and normal EF. -Cardiology was consulted and they recommended medical management and place the patient on nitroglycerin as well as Imdur -D-dimer was less than 0.27 -They recommended following up  in outpatient setting and he will be discharged on Imdur and nitroglycerin  Hypoxia: -Per patient's nurse, he was intermittently hypoxic to the 70s at triage and was complaining of shortness of breath.  -Since being in the ED room, satting well on 2 L supplemental oxygen and oxygen saturation has not dropped.  -Lungs clear on exam but diminished. No increased work of breathing.  -Chest x-ray showing no active cardiopulmonary disease. -Give a dose of IV Lasix 40 mg;  -Continuous pulse ox. Continue supplemental oxygen at this time, wean as tolerated.  -Ordered an ABG as the patient was more somnolent and showed pH of 7.417, PCO2 of 81.4, PO2 of 71.6, ABG O2 saturation of 92.4% and a bicarbonate level of 32.5 -Patient became extremely somnolent and then extremely agitated after -Continue supplemental oxygen via nasal cannula and weaned to room air -Continuous pulse oximetry and maintain O2 saturations greater than 92% -We will order CPAP for the night -Check chest x-ray this a.m. and showed no active disease -He ambulated and did not desaturate and did not require any supplemental oxygen via nasal cannula for home  CAD -Reported by patient -Resume aspirin, statin, metoprolol and lisinopril; resume the beta-blocker and ACE inhibitor once the dosing is known -Continue with nitroglycerin and Imdur started by cardiology -I have added a statin and he is not on a statin will discharge with atorvastatin 40 mg daily  History of CVA -Continue Eliquis and aspirin as well as statin  A. Fib -Currently in sinus rhythm. Resume Eliquis and metoprolol after pharmacy med rec is done and dosing of known.  -Per patient, flecainide was stopped during his recent hospitalization. -Follow-up with cardiology in outpatient setting  Microcytic anemia -Hemoglobin 10.7 and MCV 70. No prior labs for comparison.  -Not endorsing any symptoms of GI bleed. Unclear if he has had a colonoscopy done  previously. -An iron and anemia panel done and showed an iron level of 87, U IBC of 290, TIBC of 377, saturation ratios of 23%, ferritin level of 132 -Repeat hemoglobin/hematocrit was stable today at 10.2/29.4 -Continue to monitor for signs and symptoms of bleeding; currently no overt bleeding noted -Repeat CBC in a.m.  Thrombocytopenia  -Patient platelet count 134,000 and slightly dropped 125,000 -Continue to monitor for signs and symptoms of bleeding; no overt bleeding noted -Repeat CBC within 1 week  Tobacco Use -Patient reports smoking 1 pack of cigarettes daily for the past 50 years.  -NicoDerm patch and continue to counsel on smoking cessation. -We will discharge with nicotine patch  Diabetes Mellitus -Unclear whether he is on insulin and/or oral meds but he states that he takes Lantus at night.  -Check A1c in the a.m. and is not done this morning so can be done outpatient setting -Sliding scale insulin sensitive ACHSand CBG checks. -Home insulin at discharge  Chronic Back Pain -Patient reports taking Vicodin 10 mg as needed at home and is requesting a dose of his pain medication. -He also takes Levorphanol   Somnolence and then extreme agitation -Likely in the setting of his hypercarbia and suspected obstructive sleep apnea/OHS -Is extremely somnolent this morning then became extremely agitated but his ABG and stated that he felt diaphoretic and he was not actually diaphoretic -ABG as above -Check  UDS and if necessary will place on BiPAP -UDS was only positive for opiates -He did not desaturate and more awake today and not agitated. -He will need to follow-up with PCP as well as psychiatry in outpatient setting  Obesity -Estimated body mass index is 35.44 kg/m as calculated from the following:   Height as of this encounter: 5\' 9"  (1.753 m).   Weight as of this encounter: 108.9 kg. -Weight Loss and Dietary Counseling given  Suspected COPD with concomitant  OSA/OHS -We will need to ambulatory home O2 screen prior to discharge and will need outpatient sleep study -We will trial CPAP tonight however he refused -If necessary will place the patient on breathing treatments -Follow-up with PCP  Discharge Instructions  Allergies as of 09/19/2019      Reactions   Bee Venom Anaphylaxis      Medication List    TAKE these medications   amLODipine 5 MG tablet Commonly known as: NORVASC Take 5 mg by mouth daily.   aspirin EC 81 MG tablet Take 81 mg by mouth daily.   DayVigo 10 MG Tabs Generic drug: Lemborexant Take 1 tablet by mouth at bedtime.   Eliquis 5 MG Tabs tablet Generic drug: apixaban Take 5 mg by mouth 2 (two) times daily.   furosemide 40 MG tablet Commonly known as: LASIX Take 40 mg by mouth daily.   HYDROcodone-acetaminophen 10-325 MG tablet Commonly known as: NORCO Take 1 tablet by mouth 4 (four) times daily as needed for pain.   isosorbide mononitrate 30 MG 24 hr tablet Commonly known as: IMDUR Take 1 tablet (30 mg total) by mouth daily.   levorphanol 2 MG tablet Commonly known as: LEVODROMORAN Take 2 mg by mouth 2 (two) times daily as needed for pain.   LISINOPRIL PO Take by mouth.   nicotine 21 mg/24hr patch Commonly known as: NICODERM CQ - dosed in mg/24 hours Place 1 patch (21 mg total) onto the skin daily. Start taking on: Sep 20, 2019   nitroGLYCERIN 0.4 MG SL tablet Commonly known as: NITROSTAT Place 1 tablet (0.4 mg total) under the tongue every 5 (five) minutes as needed for chest pain.   UNKNOWN TO PATIENT Metoprolol cannot verify dose only 1/2 a tab qd      Follow-up Information    Nahser, Sep 22, 2019, MD Follow up.   Specialty: Cardiology Why: the office should call you Monday to schedule follow up with Friday or Vin PAs  Contact information: [address] [place] [phone]          Allergies  Allergen Reactions  . Bee Venom Anaphylaxis     Consultations:  Cardiology  Procedures/Studies: DG Chest 2 View  Result Date: 09/18/2019 CLINICAL DATA:  Chest pressure and shortness of breath. EXAM: CHEST - 2 VIEW COMPARISON:  None. FINDINGS: The heart size and mediastinal contours are within normal limits. Both lungs are clear. The visualized skeletal structures are unremarkable. IMPRESSION: No active cardiopulmonary disease. Electronically Signed   By: 11/18/2019 M.D.   On: 09/18/2019 23:52     Subjective: Seen and examined at bedside he is much more calm today.  No nausea or vomiting.  He apologized for the way he acted yesterday.  No lightheadedness or dizziness.  He did not desaturate while ambulating.  He improved and stable for discharge only to follow-up with PCP, cardiology and psychiatry and he is cardiology and psychiatry appointments within this week.  Discharge Exam: Vitals:   09/19/19  0751 09/19/19 1131  BP: 127/63 130/71  Pulse: 65 75  Resp: 16 20  Temp: 97.9 F (36.6 C) 97.6 F (36.4 C)  SpO2: 100% 90%   Vitals:   09/19/19 0230 09/19/19 0308 09/19/19 0751 09/19/19 1131  BP:  106/76 127/63 130/71  Pulse: 82 69 65 75  Resp: 20 18 16 20   Temp:  98.1 F (36.7 C) 97.9 F (36.6 C) 97.6 F (36.4 C)  TempSrc:  Oral Oral Oral  SpO2: 98% 100% 100% 90%  Weight:  108.9 kg    Height:  5\' 9"  (1.753 m)     General: Pt is alert, awake, not in acute distress Cardiovascular: RRR, S1/S2 +, no rubs, no gallops Respiratory: Diminished bilaterally, no wheezing, no rhonchi; unlabored breathing Abdominal: Soft, NT, distended secondary body habitus, bowel sounds + Extremities: Minimal edema, no cyanosis  The results of significant diagnostics from this hospitalization (including imaging, microbiology, ancillary and laboratory) are listed below for reference.    Microbiology: Recent Results (from the past 240 hour(s))  Respiratory Panel by RT PCR (Flu A&B, Covid) - Nasopharyngeal Swab     Status: None    Collection Time: 09/19/19  1:47 AM   Specimen: Nasopharyngeal Swab  Result Value Ref Range Status   SARS Coronavirus 2 by RT PCR NEGATIVE NEGATIVE Final    Comment: (NOTE) SARS-CoV-2 target nucleic acids are NOT DETECTED. The SARS-CoV-2 RNA is generally detectable in upper respiratoy specimens during the acute phase of infection. The lowest concentration of SARS-CoV-2 viral copies this assay can detect is 131 copies/mL. A negative result does not preclude SARS-Cov-2 infection and should not be used as the sole basis for treatment or other patient management decisions. A negative result may occur with  improper specimen collection/handling, submission of specimen other than nasopharyngeal swab, presence of viral mutation(s) within the areas targeted by this assay, and inadequate number of viral copies (<131 copies/mL). A negative result must be combined with clinical observations, patient history, and epidemiological information. The expected result is Negative. Fact Sheet for Patients:  Fact Sheet for Healthcare Providers:  11/19/19 This test is not yet ap proved or cleared by the https://www.moore.com/ FDA and  has been authorized for detection and/or diagnosis of SARS-CoV-2 by FDA under an Emergency Use Authorization (EUA). This EUA will remain  in effect (meaning this test can be used) for the duration of the COVID-19 declaration under Section 564(b)(1) of the Act, 21 U.S.C. section 360bbb-3(b)(1), unless the authorization is terminated or revoked sooner.    Influenza A by PCR NEGATIVE NEGATIVE Final   Influenza B by PCR NEGATIVE NEGATIVE Final    Comment: (NOTE) The Xpert Xpress SARS-CoV-2/FLU/RSV assay is intended as an aid in  the diagnosis of influenza from Nasopharyngeal swab specimens and  should not be used as a sole basis for treatment. Nasal washings and  aspirates are unacceptable for Xpert Xpress  SARS-CoV-2/FLU/RSV  testing. Fact Sheet for Patients: https://www.young.biz/ Fact Sheet for Healthcare Providers: [place] This test is not yet approved or cleared by the https://www.moore.com/ FDA and  has been authorized for detection and/or diagnosis of SARS-CoV-2 by  FDA under an Emergency Use Authorization (EUA). This EUA will remain  in effect (meaning this test can be used) for the duration of the  Covid-19 declaration under Section 564(b)(1) of the Act, 21  U.S.C. section 360bbb-3(b)(1), unless the authorization is  terminated or revoked. Performed at [facility], [address], [place], [place]  Labs: BNP (last 3 results) No results for input(s): BNP in the last 8760 hours. Basic Metabolic Panel: Recent Labs  Lab 09/18/19 2333 09/19/19 0853  NA 139 141  K 4.2 4.2  CL 98 103  CO2 31 31  GLUCOSE 98 120*  BUN 23 21  CREATININE 1.04 0.74  CALCIUM 9.8 9.0  MG  --  2.1  PHOS  --  4.3   Liver Function Tests: Recent Labs  Lab 09/19/19 0853  AST 19  ALT 17  ALKPHOS 72  BILITOT 0.7  PROT 6.8  ALBUMIN 3.8   No results for input(s): LIPASE, AMYLASE in the last 168 hours. No results for input(s): AMMONIA in the last 168 hours. CBC: Recent Labs  Lab 09/18/19 2333 09/19/19 0853  WBC 10.1 7.5  NEUTROABS 6.9 5.0  HGB 10.7* 10.4*  HCT 31.0* 29.9*  MCV 70.0* 69.7*  PLT 157 134*   Cardiac Enzymes: No results for input(s): CKTOTAL, CKMB, CKMBINDEX, TROPONINI in the last 168 hours. BNP: Invalid input(s): POCBNP CBG: Recent Labs  Lab 09/19/19 0744  GLUCAP 114*   D-Dimer Recent Labs    09/19/19 0446  DDIMER <0.27   Hgb A1c No results for input(s): HGBA1C in the last 72 hours. Lipid Profile No results for input(s): CHOL, HDL, LDLCALC, TRIG, CHOLHDL, LDLDIRECT in the last 72 hours. Thyroid function studies No results for input(s): TSH, T4TOTAL, T3FREE, THYROIDAB in the last 72  hours.  Invalid input(s): FREET3 Anemia work up Recent Labs    09/19/19 0446  FERRITIN 172  TIBC 377  IRON 87   Urinalysis No results found for: COLORURINE, APPEARANCEUR, LABSPEC, PHURINE, GLUCOSEU, HGBUR, BILIRUBINUR, KETONESUR, PROTEINUR, UROBILINOGEN, NITRITE, LEUKOCYTESUR Sepsis Labs Invalid input(s): PROCALCITONIN,  WBC,  LACTICIDVEN Microbiology Recent Results (from the past 240 hour(s))  Respiratory Panel by RT PCR (Flu A&B, Covid) - Nasopharyngeal Swab     Status: None   Collection Time: 09/19/19  1:47 AM   Specimen: Nasopharyngeal Swab  Result Value Ref Range Status   SARS Coronavirus 2 by RT PCR NEGATIVE NEGATIVE Final    Comment: (NOTE) SARS-CoV-2 target nucleic acids are NOT DETECTED. The SARS-CoV-2 RNA is generally detectable in upper respiratoy specimens during the acute phase of infection. The lowest concentration of SARS-CoV-2 viral copies this assay can detect is 131 copies/mL. A negative result does not preclude SARS-Cov-2 infection and should not be used as the sole basis for treatment or other patient management decisions. A negative result may occur with  improper specimen collection/handling, submission of specimen other than nasopharyngeal swab, presence of viral mutation(s) within the areas targeted by this assay, and inadequate number of viral copies (<131 copies/mL). A negative result must be combined with clinical observations, patient history, and epidemiological information. The expected result is Negative. Fact Sheet for Patients:  https://www.moore.com/ Fact Sheet for Healthcare Providers:  https://www.young.biz/ This test is not yet ap proved or cleared by the [place] FDA and  has been authorized for detection and/or diagnosis of SARS-CoV-2 by FDA under an Emergency Use Authorization (EUA). This EUA will remain  in effect (meaning this test can be used) for the duration of the COVID-19 declaration  under Section 564(b)(1) of the Act, 21 U.S.C. section 360bbb-3(b)(1), unless the authorization is terminated or revoked sooner.    Influenza A by PCR NEGATIVE NEGATIVE Final   Influenza B by PCR NEGATIVE NEGATIVE Final    Comment: (NOTE) The Xpert Xpress SARS-CoV-2/FLU/RSV assay is intended as an aid in  the diagnosis of influenza  from Nasopharyngeal swab specimens and  should not be used as a sole basis for treatment. Nasal washings and  aspirates are unacceptable for Xpert Xpress SARS-CoV-2/FLU/RSV  testing. Fact Sheet for Patients: https://www.moore.com/https://www.fda.gov/media/142436/download Fact Sheet for Healthcare Providers: https://www.young.biz/https://www.fda.gov/media/142435/download This test is not yet approved or cleared by the [place] FDA and  has been authorized for detection and/or diagnosis of SARS-CoV-2 by  FDA under an Emergency Use Authorization (EUA). This EUA will remain  in effect (meaning this test can be used) for the duration of the  Covid-19 declaration under Section 564(b)(1) of the Act, 21  U.S.C. section 360bbb-3(b)(1), unless the authorization is  terminated or revoked. Performed at [facility], [address] [place], [place] [address]    Time coordinating discharge: 35 minutes  SIGNED:  Merlene Laughtermair Latif Edla Para, DO [facility] 09/19/2019, 11:32 AM Pager is on AMION  If 7PM-7AM, please contact night-coverage [email or web address]

## 2019-09-19 NOTE — ED Notes (Signed)
Pt lying in bed, eye's closed, chest rising and falling. NAD noted. Pt denies any needs. Will continue to monitor.

## 2019-09-20 ENCOUNTER — Observation Stay (HOSPITAL_COMMUNITY): Payer: No Typology Code available for payment source

## 2019-09-20 ENCOUNTER — Telehealth: Payer: Self-pay | Admitting: Physician Assistant

## 2019-09-20 LAB — CBC WITH DIFFERENTIAL/PLATELET
Abs Immature Granulocytes: 0.02 10*3/uL (ref 0.00–0.07)
Basophils Absolute: 0 10*3/uL (ref 0.0–0.1)
Basophils Relative: 0 %
Eosinophils Absolute: 0.2 10*3/uL (ref 0.0–0.5)
Eosinophils Relative: 3 %
HCT: 29.4 % — ABNORMAL LOW (ref 39.0–52.0)
Hemoglobin: 10.2 g/dL — ABNORMAL LOW (ref 13.0–17.0)
Immature Granulocytes: 0 %
Lymphocytes Relative: 22 %
Lymphs Abs: 1.8 10*3/uL (ref 0.7–4.0)
MCH: 24.1 pg — ABNORMAL LOW (ref 26.0–34.0)
MCHC: 34.7 g/dL (ref 30.0–36.0)
MCV: 69.3 fL — ABNORMAL LOW (ref 80.0–100.0)
Monocytes Absolute: 0.7 10*3/uL (ref 0.1–1.0)
Monocytes Relative: 8 %
Neutro Abs: 5.6 10*3/uL (ref 1.7–7.7)
Neutrophils Relative %: 67 %
Platelets: 125 10*3/uL — ABNORMAL LOW (ref 150–400)
RBC: 4.24 MIL/uL (ref 4.22–5.81)
RDW: 16.3 % — ABNORMAL HIGH (ref 11.5–15.5)
WBC: 8.3 10*3/uL (ref 4.0–10.5)
nRBC: 0.2 % (ref 0.0–0.2)

## 2019-09-20 LAB — GLUCOSE, CAPILLARY: Glucose-Capillary: 155 mg/dL — ABNORMAL HIGH (ref 70–99)

## 2019-09-20 LAB — HEMOGLOBIN A1C
Hgb A1c MFr Bld: 5.8 % — ABNORMAL HIGH (ref 4.8–5.6)
Mean Plasma Glucose: 120 mg/dL

## 2019-09-20 LAB — COMPREHENSIVE METABOLIC PANEL
ALT: 15 U/L (ref 0–44)
AST: 15 U/L (ref 15–41)
Albumin: 3.7 g/dL (ref 3.5–5.0)
Alkaline Phosphatase: 68 U/L (ref 38–126)
Anion gap: 8 (ref 5–15)
BUN: 18 mg/dL (ref 8–23)
CO2: 31 mmol/L (ref 22–32)
Calcium: 9.2 mg/dL (ref 8.9–10.3)
Chloride: 101 mmol/L (ref 98–111)
Creatinine, Ser: 0.86 mg/dL (ref 0.61–1.24)
GFR calc Af Amer: >60 mL/min (ref >60)
GFR calc non Af Amer: >60 mL/min (ref >60)
Glucose, Bld: 160 mg/dL — ABNORMAL HIGH (ref 70–99)
Potassium: 3.9 mmol/L (ref 3.5–5.1)
Sodium: 140 mmol/L (ref 135–145)
Total Bilirubin: 0.7 mg/dL (ref 0.3–1.2)
Total Protein: 6.6 g/dL (ref 6.5–8.1)

## 2019-09-20 LAB — PHOSPHORUS: Phosphorus: 3.5 mg/dL (ref 2.5–4.6)

## 2019-09-20 LAB — MAGNESIUM: Magnesium: 2 mg/dL (ref 1.7–2.4)

## 2019-09-20 MED ORDER — HYDROXYZINE HCL 25 MG PO TABS: 25.0000 mg | ORAL_TABLET | Freq: Three times a day (TID) | ORAL | Status: DC | PRN

## 2019-09-20 NOTE — TOC Transition Note (Signed)
Transition of Care [facility]) - CM/SW Discharge Note   Patient Details  Name: Eric Forbes MRN: [id number] Date of Birth: [birth year]  [facility] CM/SW Contact:  Lanier Clam, RN Phone Number: 09/20/2019, 10:21 AM   Clinical Narrative: d/c home today. Spoke to patient about insurance-provided w/admit tel# to change primary insurance to [place]. No further CM needs.            Patient Goals and CMS Choice        Discharge Placement                       Discharge Plan and Services                                     Social Determinants of Health (SDOH) Interventions     Readmission Risk Interventions No flowsheet data found.

## 2019-09-20 NOTE — Telephone Encounter (Signed)
Follow up  Called patient to call back and to schedule an appt.

## 2019-09-20 NOTE — Progress Notes (Signed)
SATURATION QUALIFICATIONS: (This note is used to comply with regulatory documentation for home oxygen)  Patient Saturations on Room Air at Rest = 97%  Patient Saturations on Room Air while Ambulating = 95%   Please briefly explain why patient needs home oxygen:  Does not qualify

## 2019-09-20 NOTE — Care Management Obs Status (Signed)
MEDICARE OBSERVATION STATUS NOTIFICATION   Patient Details  Name: Eric Forbes MRN: [id number] Date of Birth: 27-Jun-1955   Medicare Observation Status Notification Given:  Yes    MahabirOlegario Messier, RN 09/20/2019, 9:48 AM

## 2019-09-20 NOTE — Telephone Encounter (Signed)
-----   Message from [name], NP sent at 09/19/2019  8:59 AM EDT ----- Pt needs follow up in 2 weeks with Eric Forbes thanks.

## 2022-01-02 IMAGING — CR DG CHEST 2V
2 series · 2 of 2 positions shown · non-contrast
Comparison: None.

CLINICAL DATA: Chest pressure and shortness of breath.

EXAM:
CHEST - 2 VIEW

[w chest pa]
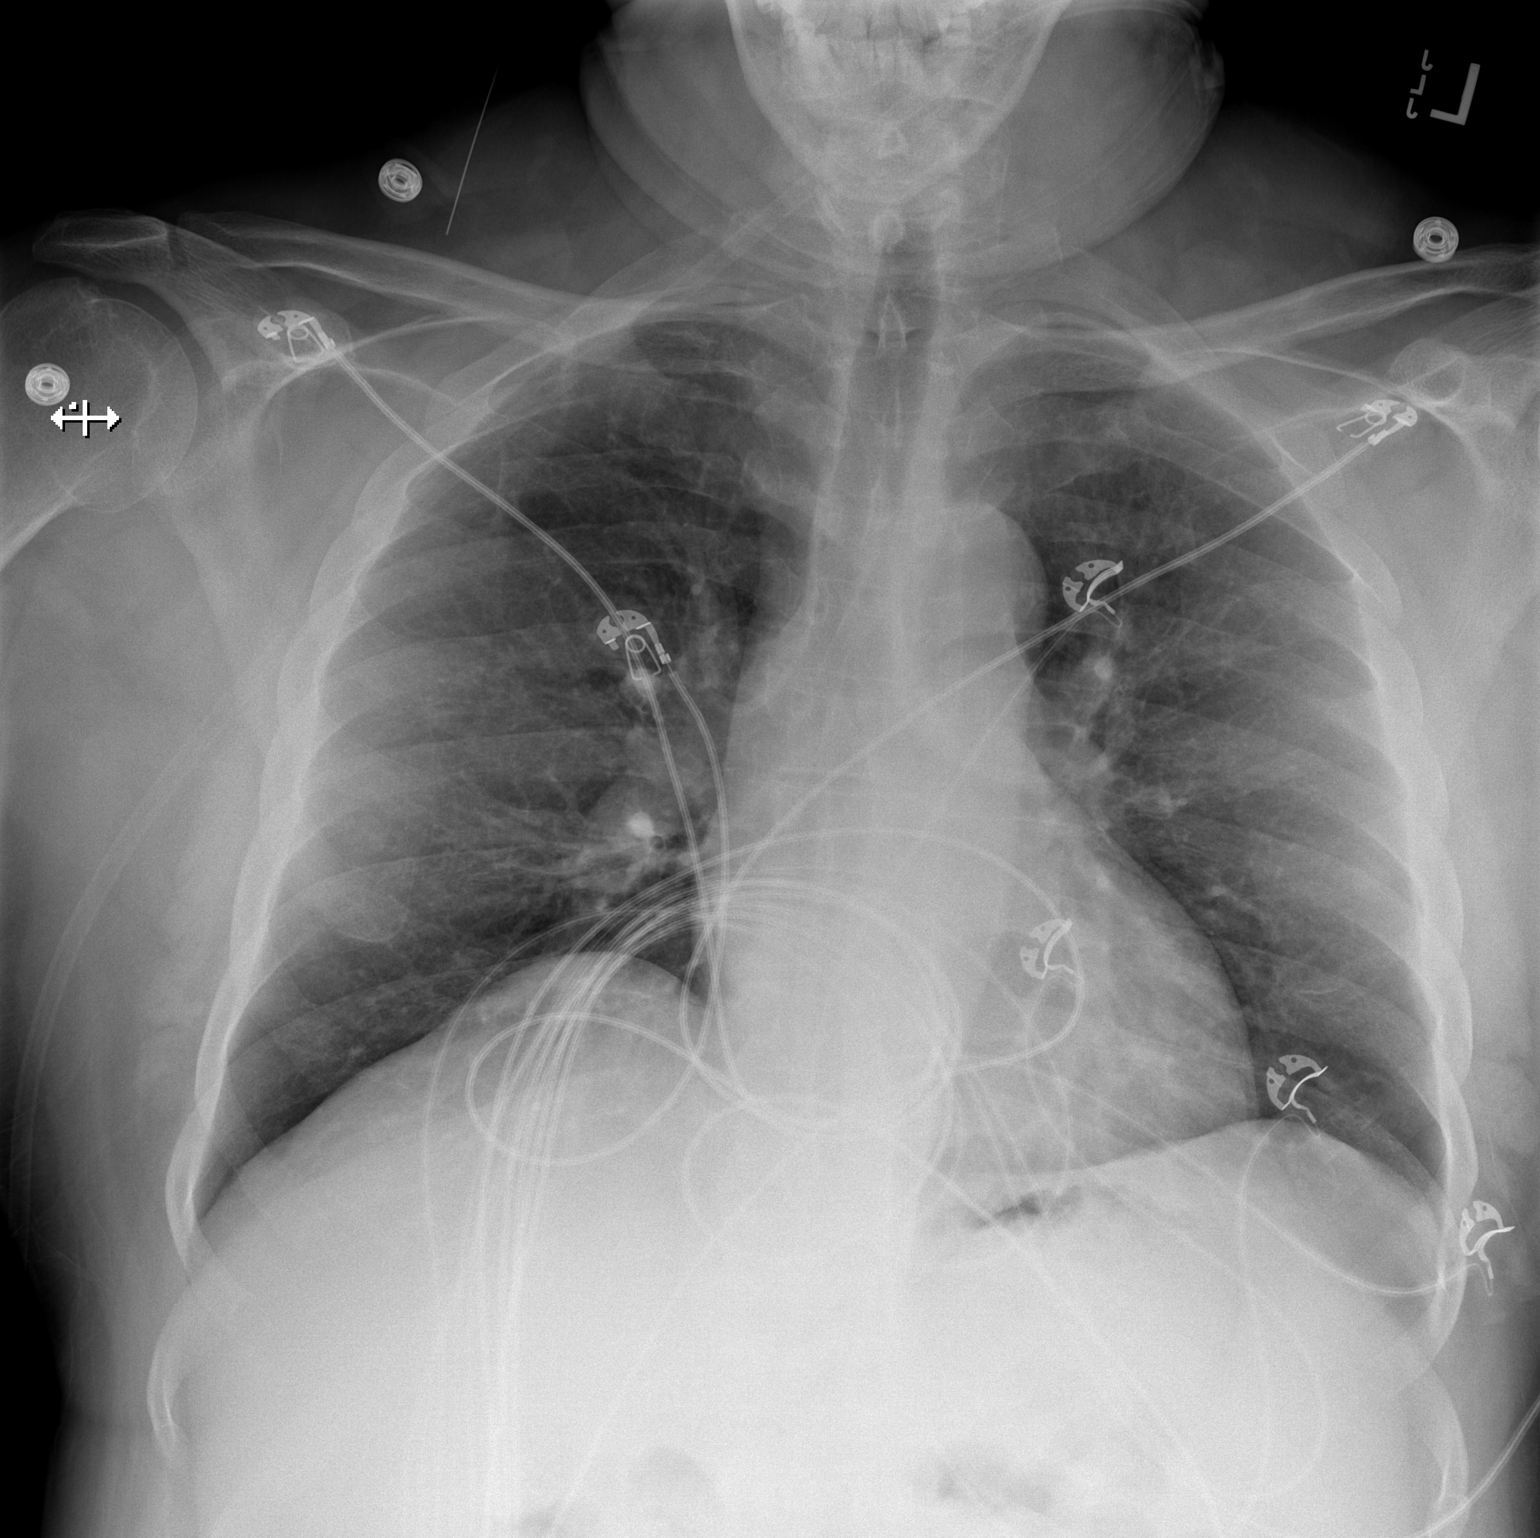

[w chest lat]
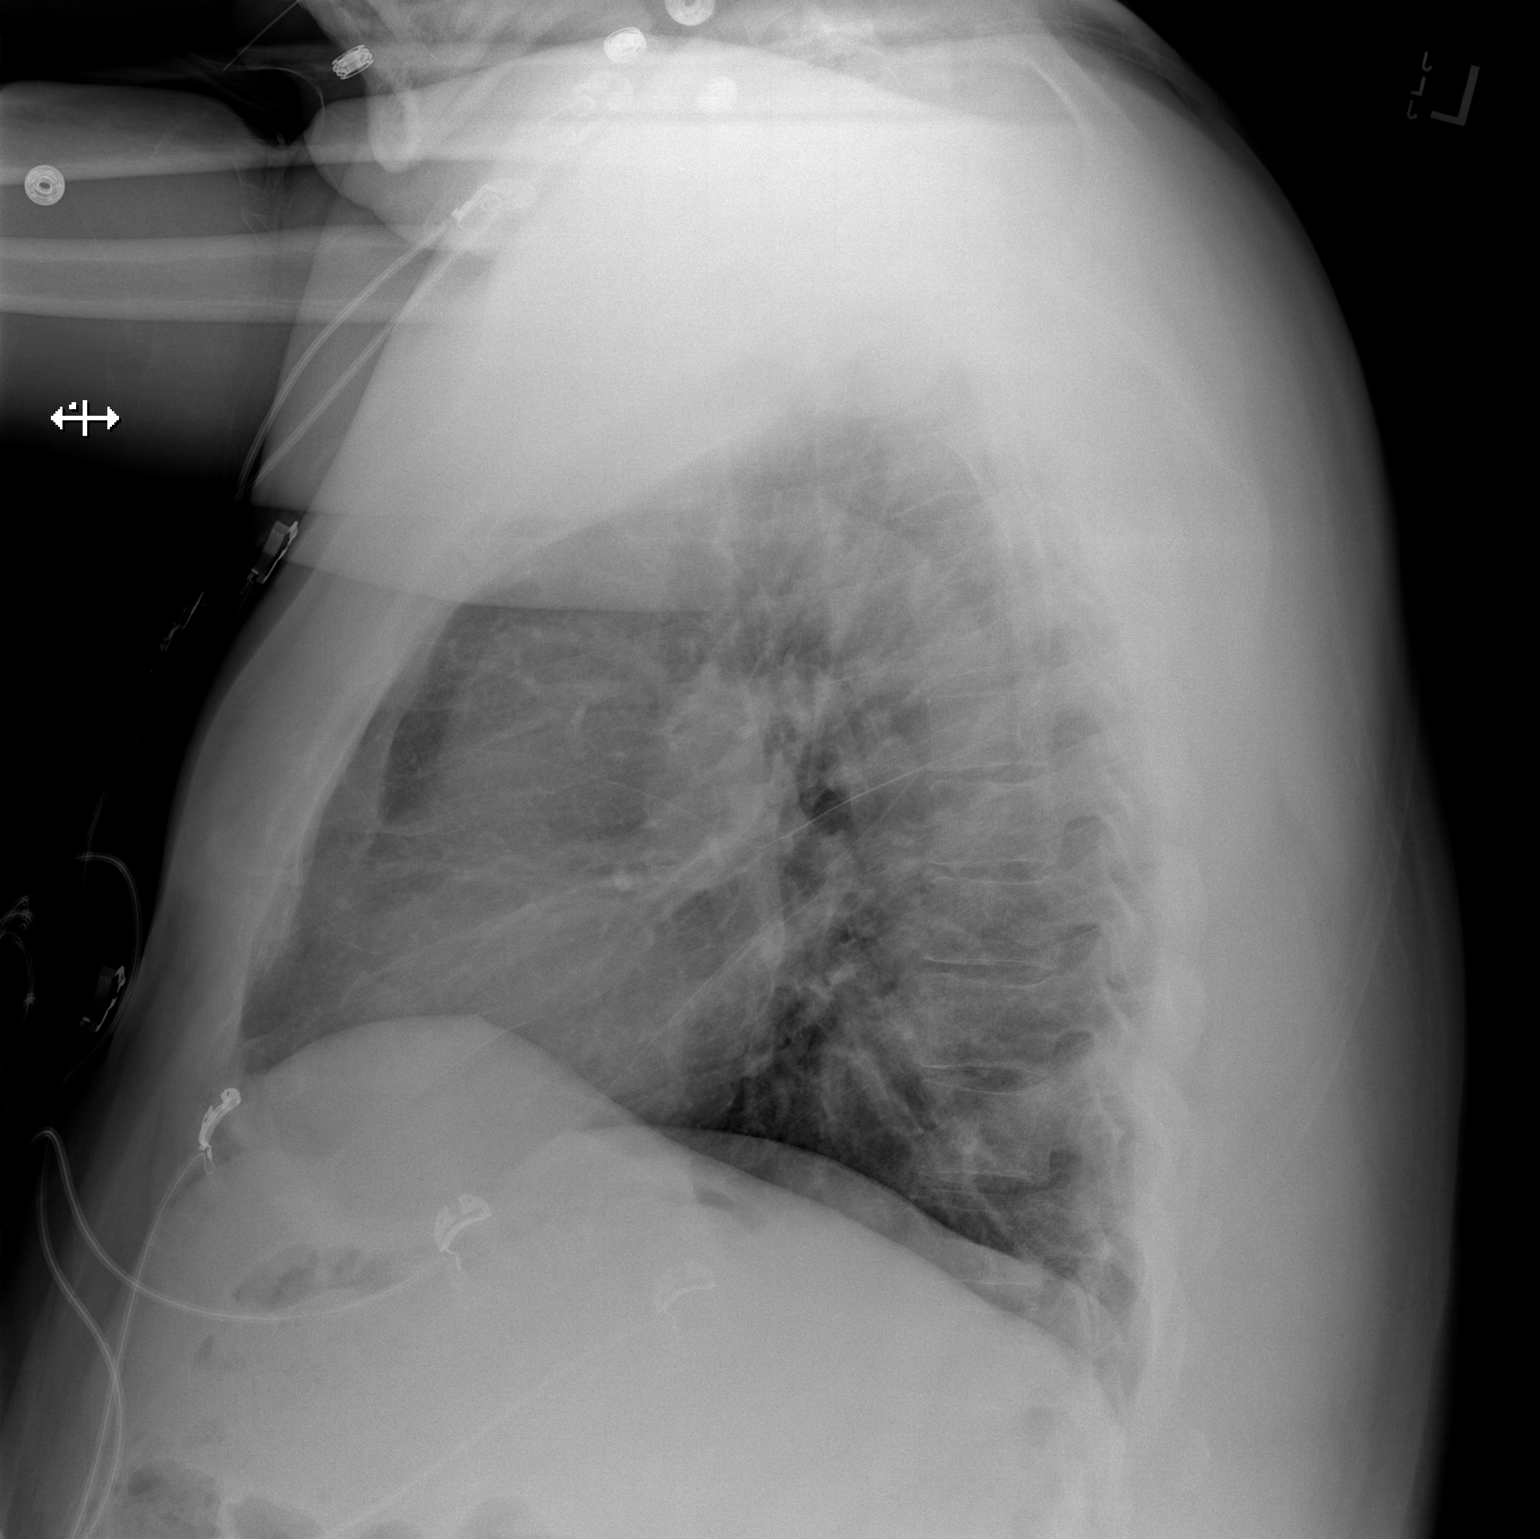

[2 of 2 positions shown; findings below may reference images not displayed]

FINDINGS: The heart size and mediastinal contours are within normal limits.
Both lungs are clear. The visualized skeletal structures are
unremarkable.
IMPRESSION: No active cardiopulmonary disease.

## 2022-03-19 DIAGNOSIS — I6529 Occlusion and stenosis of unspecified carotid artery: Secondary | ICD-10-CM | POA: Insufficient documentation

## 2022-10-03 ENCOUNTER — Other Ambulatory Visit: Payer: Self-pay

## 2022-10-03 ENCOUNTER — Emergency Department
Admission: EM | Admit: 2022-10-03 | Discharge: 2022-10-03 | Disposition: A | Discharge status: Home / Self Care | Payer: No Typology Code available for payment source | Attending: Emergency Medicine | Admitting: Emergency Medicine

## 2022-10-03 DIAGNOSIS — G8190 Hemiplegia, unspecified affecting unspecified side: Secondary | ICD-10-CM | POA: Diagnosis not present

## 2022-10-03 DIAGNOSIS — I10 Essential (primary) hypertension: Secondary | ICD-10-CM | POA: Insufficient documentation

## 2022-10-03 DIAGNOSIS — E119 Type 2 diabetes mellitus without complications: Secondary | ICD-10-CM | POA: Diagnosis not present

## 2022-10-03 DIAGNOSIS — R319 Hematuria, unspecified: Secondary | ICD-10-CM | POA: Diagnosis not present

## 2022-10-03 LAB — URINALYSIS, W/ REFLEX TO CULTURE (INFECTION SUSPECTED)
Bacteria, UA: NONE SEEN
Bilirubin Urine: NEGATIVE
Glucose, UA: 50 mg/dL — AB
Ketones, ur: NEGATIVE mg/dL
Leukocytes,Ua: NEGATIVE
Nitrite: NEGATIVE
Protein, ur: 30 mg/dL — AB
Specific Gravity, Urine: 1.01 (ref 1.005–1.030)
Squamous Epithelial / HPF: NONE SEEN /HPF (ref 0–5)
pH: 6 (ref 5.0–8.0)

## 2022-10-03 MED ORDER — HYDROXYZINE HCL 25 MG PO TABS: 25.0000 mg | ORAL_TABLET | Freq: Once | ORAL | Status: DC

## 2022-10-03 NOTE — Discharge Instructions (Signed)
Your urinalysis today looked normal.  Please follow-up with your doctor for further evaluation of your symptoms.

## 2022-10-03 NOTE — ED Triage Notes (Signed)
Pt BIB EMS from [facility] for c/o pt having blood in urine. No noted hematuria in triage, pt states he feels fine, though does have extensive cardiac history and afib with failed cardioversion, per pt last week

## 2022-10-03 NOTE — ED Provider Notes (Signed)
[facility] Provider Note    Event Date/Time   First MD Initiated Contact with Patient 10/03/22 2024     (approximate)   History   Chief Complaint: Hematuria   HPI  Eric Forbes is a 67 y.o. male with a past history of stroke, hypertension, diabetes, atrial fibrillation, on anticoagulation who was sent to the ED from [facility] due to blood in the urine which was just noticed this evening.  Patient denies any fevers chills or pain.  States that he feels normal except that he is a little bit anxious and requests his as needed hydroxyzine.     Physical Exam   Triage Vital Signs: ED Triage Vitals  Enc Vitals Group     BP 10/03/22 2032 (!) 161/117     Pulse Rate 10/03/22 2032 74     Resp 10/03/22 2030 16     Temp 10/03/22 2032 98.2 F (36.8 C)     Temp Source 10/03/22 2032 Oral     SpO2 10/03/22 2032 99 %     Weight --      Height --      Head Circumference --      Peak Flow --      Pain Score 10/03/22 2032 0     Pain Loc --      Pain Edu? --      Excl. in GC? --     Most recent vital signs: Vitals:   10/03/22 2030 10/03/22 2032  BP:  (!) 161/117  Pulse:  74  Resp: 16 20  Temp:  98.2 F (36.8 C)  SpO2:  99%    General: Awake, no distress.  CV:  Good peripheral perfusion.  Resp:  Normal effort.  Abd:  No distention.  Soft, nontender, no indwelling catheter Other:  Clear speech, calm and interactive   ED Results / Procedures / Treatments   Labs (all labs ordered are listed, but only abnormal results are displayed) Labs Reviewed  URINALYSIS, W/ REFLEX TO CULTURE (INFECTION SUSPECTED) - Abnormal; Notable for the following components:      Result Value   Color, Urine YELLOW (*)    APPearance CLEAR (*)    Glucose, UA 50 (*)    Hgb urine dipstick SMALL (*)    Protein, ur 30 (*)    All other components within normal limits     EKG    RADIOLOGY    PROCEDURES:  Procedures   MEDICATIONS ORDERED IN  ED: Medications  hydrOXYzine (ATARAX) tablet 25 mg (has no administration in time range)     IMPRESSION / MDM / ASSESSMENT AND PLAN / ED COURSE  I reviewed the triage vital signs and the nursing notes.  DDx: Hematuria, cystitis  Patient's presentation is most consistent with acute illness / injury with system symptoms.  Patient sent to the ED from his SNF for evaluation of suspected hematuria.  He has no symptoms.  Vital signs are unremarkable, exam is benign.  Urinalysis was obtained which is negative for any blood or signs of infection.  Stable for discharge back to his facility.  Will give a dose of his hydroxyzine.      FINAL CLINICAL IMPRESSION(S) / ED DIAGNOSES   Final diagnoses:  Hemiplegia, unspecified etiology, unspecified hemiplegia type, unspecified laterality (HCC)     Rx / DC Orders   ED Discharge Orders     None        Note:  This document was prepared using Dragon  voice recognition software and may include unintentional dictation errors.   Sharman Cheek, MD 10/03/22 2102

## 2022-10-08 DIAGNOSIS — I739 Peripheral vascular disease, unspecified: Secondary | ICD-10-CM | POA: Insufficient documentation

## 2022-11-17 ENCOUNTER — Other Ambulatory Visit: Payer: Self-pay

## 2022-11-17 ENCOUNTER — Emergency Department
Admission: EM | Admit: 2022-11-17 | Discharge: 2022-11-17 | Disposition: A | Discharge status: Home / Self Care | Payer: No Typology Code available for payment source | Attending: Emergency Medicine | Admitting: Emergency Medicine

## 2022-11-17 DIAGNOSIS — Z7901 Long term (current) use of anticoagulants: Secondary | ICD-10-CM | POA: Diagnosis not present

## 2022-11-17 DIAGNOSIS — S29011A Strain of muscle and tendon of front wall of thorax, initial encounter: Secondary | ICD-10-CM | POA: Insufficient documentation

## 2022-11-17 DIAGNOSIS — M549 Dorsalgia, unspecified: Secondary | ICD-10-CM

## 2022-11-17 DIAGNOSIS — I10 Essential (primary) hypertension: Secondary | ICD-10-CM | POA: Insufficient documentation

## 2022-11-17 DIAGNOSIS — W01198A Fall on same level from slipping, tripping and stumbling with subsequent striking against other object, initial encounter: Secondary | ICD-10-CM | POA: Diagnosis not present

## 2022-11-17 DIAGNOSIS — S161XXA Strain of muscle, fascia and tendon at neck level, initial encounter: Secondary | ICD-10-CM | POA: Insufficient documentation

## 2022-11-17 DIAGNOSIS — T148XXA Other injury of unspecified body region, initial encounter: Secondary | ICD-10-CM

## 2022-11-17 DIAGNOSIS — S199XXA Unspecified injury of neck, initial encounter: Secondary | ICD-10-CM | POA: Diagnosis present

## 2022-11-17 DIAGNOSIS — E119 Type 2 diabetes mellitus without complications: Secondary | ICD-10-CM | POA: Insufficient documentation

## 2022-11-17 DIAGNOSIS — S29012A Strain of muscle and tendon of back wall of thorax, initial encounter: Secondary | ICD-10-CM | POA: Diagnosis not present

## 2022-11-17 DIAGNOSIS — M79601 Pain in right arm: Secondary | ICD-10-CM | POA: Insufficient documentation

## 2022-11-17 MED ORDER — CYCLOBENZAPRINE HCL 10 MG PO TABS
10.0000 mg | ORAL_TABLET | Freq: Once | ORAL | Status: AC
  Administered 2022-11-17: 10 mg via ORAL
  Filled 2022-11-17: qty 1

## 2022-11-17 MED ORDER — HYDROCODONE-ACETAMINOPHEN 5-325 MG PO TABS
1.0000 | ORAL_TABLET | Freq: Once | ORAL | Status: AC
  Administered 2022-11-17: 1 via ORAL
  Filled 2022-11-17: qty 1

## 2022-11-17 MED ORDER — CYCLOBENZAPRINE HCL 10 MG PO TABS: 10.0000 mg | ORAL_TABLET | Freq: Three times a day (TID) | ORAL | 0 refills | Status: AC | PRN

## 2022-11-17 NOTE — ED Triage Notes (Signed)
On arrival to ED via [facility], patient expressed his need to void.  Patient assisted to BR. Sitting on commode patient stated unable to void.  Requested a urinal, urinal given.    Patient is AAOx3.  Skin warm and dry. NAD. No SOB, DOE.

## 2022-11-17 NOTE — Discharge Instructions (Signed)
Return to the ER for new, worsening, or persistent severe pain, weakness or numbness, or any other new or worsening symptoms that concern you.

## 2022-11-17 NOTE — ED Notes (Signed)
Pt verbalizes understanding of discharge instructions. Opportunity for questioning and answers were provided. Pt discharged from ED to home with brother.    

## 2022-11-17 NOTE — ED Triage Notes (Signed)
First Nurse Note:   Pt via [facility] from [facility]. Pt c/o weakness and pain in his back and bilateral feet and toes., pt did have a fall yesterday. Pt has a hx of stroke with L side deficits. Pt is A&Ox4 and NAD  140/80 BP  93% on RA  85 HR

## 2022-11-17 NOTE — ED Notes (Signed)
Called patient's brother for a ride back to the facility. Tried calling [facility] twice without success

## 2022-11-17 NOTE — ED Triage Notes (Signed)
See first nurse note. Pt was walking with rollator and almost fell yesterday, did not hit floor. Pt however twisted whole body and now whole body hurting: elbows, back, feet, toes. Pt also gassy and burping.  Hx stroke with L sided deficits/weakness. From [facility] via [facility]. Pt states has hx anxiety and is veteran. Took hydroxyzine before EMS arrived.

## 2022-11-17 NOTE — ED Provider Notes (Signed)
[facility] Provider Note    Event Date/Time   First MD Initiated Contact with Patient 11/17/22 1105     (approximate)   History   Fall   HPI  Eric Forbes is a 68 y.o. male with a history of CVA, hypertension, diabetes, and atrial fibrillation on Eliquis who presents with back, flank, neck, and arm pain after a fall yesterday.  The patient has had a stroke with residual left-sided weakness.  He walks with a rollator.  He states that yesterday he was walking, lost his balance and started to fall back.  He states that he held onto the rollator and leaned backwards, letting himself down onto the ground.  He jerked back with his right arm at 1 point as he was going onto the ground.  However, he states he did not actually impact the ground with any part of his body.  He did not hit his head or lose consciousness.  He states that he came down on the ground on his lower back.  The patient reports pain to the right lower/mid back radiating to the right flank, the right side of the neck, and the left shoulder.  He also has some pain in the right arm especially around the elbow.  He reports some tingling in the right hand.  He denies any new weakness or numbness.  I reviewed the past medical records.  The patient was seen in the ED on 5/23 for hematuria.  Prior to this he was admitted to the hospital service in 2021 for chest pain and was ruled out for ACS.   Physical Exam   Triage Vital Signs: ED Triage Vitals  Enc Vitals Group     BP 11/17/22 1039 115/86     Pulse Rate 11/17/22 1039 92     Resp 11/17/22 1039 20     Temp 11/17/22 1039 98.5 F (36.9 C)     Temp Source 11/17/22 1039 Oral     SpO2 11/17/22 1039 94 %     Weight 11/17/22 1040 255 lb (115.7 kg)     Height 11/17/22 1040 5\' 10"  (1.778 m)     Head Circumference --      Peak Flow --      Pain Score 11/17/22 1039 10     Pain Loc --      Pain Edu? --      Excl. in GC? --     Most recent vital  signs: Vitals:   11/17/22 1039  BP: 115/86  Pulse: 92  Resp: 20  Temp: 98.5 F (36.9 C)  SpO2: 94%     General: Awake, no distress.  CV:  Good peripheral perfusion.  Resp:  Normal effort.  Abd:  No distention.  Other:  No midline spinal tenderness.  Right thoracic paraspinal tenderness and lateral chest wall muscle tenderness.  Mild right paraspinal neck tenderness.  5/5 motor strength and intact sensation to right upper and lower extremity.  Left hemiparesis, chronic for the patient.   ED Results / Procedures / Treatments   Labs (all labs ordered are listed, but only abnormal results are displayed) Labs Reviewed - No data to display   EKG    RADIOLOGY    PROCEDURES:  Critical Care performed: No  Procedures   MEDICATIONS ORDERED IN ED: Medications  cyclobenzaprine (FLEXERIL) tablet 10 mg (10 mg Oral Given 11/17/22 1135)  HYDROcodone-acetaminophen (NORCO/VICODIN) 5-325 MG per tablet 1 tablet (1 tablet Oral Given 11/17/22 1135)  IMPRESSION / MDM / ASSESSMENT AND PLAN / ED COURSE  I reviewed the triage vital signs and the nursing notes.  67 year old male with PMH as noted above presents after a fall off his rollator yesterday, although the patient states that he twisted his body and stretch himself, but ultimately let himself down onto his back without actually hitting anything on to the ground with any force.  He reports pain to multiple areas including the left shoulder, right neck, and right mid/lower back.  On exam, there is no bony tenderness, he has full range of motion of the joints, and no neurologic deficits.  Differential diagnosis includes, but is not limited to, muscle strain/spasm, tenderness or other soft tissue injury.  There is no evidence of acute fracture or of a head injury.  At this time, based on my clinical examination and the patient's description of the incident and his symptoms, there is no indication for imaging.  We will give Flexeril and  Norco as the patient is on Eliquis so I would like to avoid NSAIDs.  Patient's presentation is most consistent with acute, uncomplicated illness.  ----------------------------------------- 12:57 PM on 11/17/2022 -----------------------------------------  The patient is feeling somewhat better after the medications.  He is stable for discharge back to his facility at this time.  I gave strict return precautions and he expressed understanding.  FINAL CLINICAL IMPRESSION(S) / ED DIAGNOSES   Final diagnoses:  Acute right-sided back pain, unspecified back location  Muscle strain     Rx / DC Orders   ED Discharge Orders          Ordered    cyclobenzaprine (FLEXERIL) 10 MG tablet  3 times daily PRN        11/17/22 1256             Note:  This document was prepared using Dragon voice recognition software and may include unintentional dictation errors.    Dionne Bucy, MD 11/17/22 1257

## 2022-12-27 ENCOUNTER — Inpatient Hospital Stay
Admission: EM | Admit: 2022-12-27 | Discharge: 2023-01-16 | DRG: 603 | Disposition: A | Discharge status: Skilled Nursing Facility | Payer: No Typology Code available for payment source | Source: Skilled Nursing Facility | Attending: Internal Medicine | Admitting: Internal Medicine

## 2022-12-27 ENCOUNTER — Emergency Department: Payer: No Typology Code available for payment source

## 2022-12-27 ENCOUNTER — Other Ambulatory Visit: Payer: Self-pay

## 2022-12-27 DIAGNOSIS — R7989 Other specified abnormal findings of blood chemistry: Secondary | ICD-10-CM | POA: Diagnosis not present

## 2022-12-27 DIAGNOSIS — N400 Enlarged prostate without lower urinary tract symptoms: Secondary | ICD-10-CM | POA: Diagnosis present

## 2022-12-27 DIAGNOSIS — E669 Obesity, unspecified: Secondary | ICD-10-CM | POA: Diagnosis present

## 2022-12-27 DIAGNOSIS — Z79899 Other long term (current) drug therapy: Secondary | ICD-10-CM

## 2022-12-27 DIAGNOSIS — Z7901 Long term (current) use of anticoagulants: Secondary | ICD-10-CM

## 2022-12-27 DIAGNOSIS — I480 Paroxysmal atrial fibrillation: Secondary | ICD-10-CM | POA: Diagnosis present

## 2022-12-27 DIAGNOSIS — E119 Type 2 diabetes mellitus without complications: Secondary | ICD-10-CM | POA: Diagnosis not present

## 2022-12-27 DIAGNOSIS — I10 Essential (primary) hypertension: Secondary | ICD-10-CM | POA: Insufficient documentation

## 2022-12-27 DIAGNOSIS — Z8249 Family history of ischemic heart disease and other diseases of the circulatory system: Secondary | ICD-10-CM

## 2022-12-27 DIAGNOSIS — L03116 Cellulitis of left lower limb: Principal | ICD-10-CM | POA: Diagnosis present

## 2022-12-27 DIAGNOSIS — Z6836 Body mass index (BMI) 36.0-36.9, adult: Secondary | ICD-10-CM

## 2022-12-27 DIAGNOSIS — R0602 Shortness of breath: Secondary | ICD-10-CM | POA: Diagnosis not present

## 2022-12-27 DIAGNOSIS — E1165 Type 2 diabetes mellitus with hyperglycemia: Secondary | ICD-10-CM | POA: Diagnosis present

## 2022-12-27 DIAGNOSIS — I4891 Unspecified atrial fibrillation: Secondary | ICD-10-CM | POA: Diagnosis present

## 2022-12-27 DIAGNOSIS — R600 Localized edema: Secondary | ICD-10-CM | POA: Diagnosis present

## 2022-12-27 DIAGNOSIS — I69354 Hemiplegia and hemiparesis following cerebral infarction affecting left non-dominant side: Secondary | ICD-10-CM

## 2022-12-27 DIAGNOSIS — I2510 Atherosclerotic heart disease of native coronary artery without angina pectoris: Secondary | ICD-10-CM | POA: Diagnosis present

## 2022-12-27 DIAGNOSIS — R252 Cramp and spasm: Secondary | ICD-10-CM | POA: Diagnosis not present

## 2022-12-27 DIAGNOSIS — E538 Deficiency of other specified B group vitamins: Secondary | ICD-10-CM | POA: Diagnosis present

## 2022-12-27 DIAGNOSIS — I252 Old myocardial infarction: Secondary | ICD-10-CM

## 2022-12-27 DIAGNOSIS — F32A Depression, unspecified: Secondary | ICD-10-CM | POA: Diagnosis present

## 2022-12-27 DIAGNOSIS — K5900 Constipation, unspecified: Secondary | ICD-10-CM | POA: Diagnosis not present

## 2022-12-27 DIAGNOSIS — Z7982 Long term (current) use of aspirin: Secondary | ICD-10-CM

## 2022-12-27 DIAGNOSIS — L03119 Cellulitis of unspecified part of limb: Secondary | ICD-10-CM

## 2022-12-27 DIAGNOSIS — R531 Weakness: Secondary | ICD-10-CM | POA: Diagnosis present

## 2022-12-27 DIAGNOSIS — E1169 Type 2 diabetes mellitus with other specified complication: Secondary | ICD-10-CM

## 2022-12-27 DIAGNOSIS — F17210 Nicotine dependence, cigarettes, uncomplicated: Secondary | ICD-10-CM | POA: Diagnosis present

## 2022-12-27 LAB — GLUCOSE, CAPILLARY
Glucose-Capillary: 98 mg/dL (ref 70–99)
Glucose-Capillary: 241 mg/dL — ABNORMAL HIGH (ref 70–99)

## 2022-12-27 LAB — CBC
HCT: 38.4 % — ABNORMAL LOW (ref 39.0–52.0)
Hemoglobin: 13.6 g/dL (ref 13.0–17.0)
MCH: 24.9 pg — ABNORMAL LOW (ref 26.0–34.0)
MCHC: 35.4 g/dL (ref 30.0–36.0)
MCV: 70.2 fL — ABNORMAL LOW (ref 80.0–100.0)
Platelets: 168 10*3/uL (ref 150–400)
RBC: 5.47 MIL/uL (ref 4.22–5.81)
RDW: 16.1 % — ABNORMAL HIGH (ref 11.5–15.5)
WBC: 10 10*3/uL (ref 4.0–10.5)
nRBC: 0.3 % — ABNORMAL HIGH (ref 0.0–0.2)

## 2022-12-27 LAB — BASIC METABOLIC PANEL
Anion gap: 8 (ref 5–15)
BUN: 17 mg/dL (ref 8–23)
CO2: 28 mmol/L (ref 22–32)
Calcium: 9.2 mg/dL (ref 8.9–10.3)
Chloride: 100 mmol/L (ref 98–111)
Creatinine, Ser: 0.88 mg/dL (ref 0.61–1.24)
GFR, Estimated: >60 mL/min (ref >60)
Glucose, Bld: 183 mg/dL — ABNORMAL HIGH (ref 70–99)
Potassium: 4.1 mmol/L (ref 3.5–5.1)
Sodium: 136 mmol/L (ref 135–145)

## 2022-12-27 LAB — LACTIC ACID, PLASMA: Lactic Acid, Venous: 1.2 mmol/L (ref 0.5–1.9)

## 2022-12-27 MED ORDER — FUROSEMIDE 40 MG PO TABS
60.0000 mg | ORAL_TABLET | Freq: Two times a day (BID) | ORAL | Status: DC
  Administered 2022-12-27: 60 mg via ORAL
  Filled 2022-12-27: qty 1

## 2022-12-27 MED ORDER — ACETAMINOPHEN 500 MG PO TABS
500.0000 mg | ORAL_TABLET | Freq: Three times a day (TID) | ORAL | Status: DC
  Administered 2022-12-27 – 2023-01-16 (×57): 500 mg via ORAL
  Filled 2022-12-27 (×57): qty 1

## 2022-12-27 MED ORDER — INSULIN ASPART 100 UNIT/ML IJ SOLN
0.0000 [IU] | Freq: Three times a day (TID) | INTRAMUSCULAR | Status: DC
  Administered 2022-12-28 (×2): 4 [IU] via SUBCUTANEOUS
  Administered 2022-12-28: 3 [IU] via SUBCUTANEOUS
  Administered 2022-12-29 (×2): 4 [IU] via SUBCUTANEOUS
  Administered 2022-12-29: 11 [IU] via SUBCUTANEOUS
  Administered 2022-12-30: 7 [IU] via SUBCUTANEOUS
  Filled 2022-12-27 (×8): qty 1

## 2022-12-27 MED ORDER — ONDANSETRON HCL 4 MG PO TABS: 4.0000 mg | ORAL_TABLET | Freq: Four times a day (QID) | ORAL | Status: DC | PRN

## 2022-12-27 MED ORDER — DULOXETINE HCL 30 MG PO CPEP: 30.0000 mg | ORAL_CAPSULE | Freq: Every day | ORAL | Status: DC

## 2022-12-27 MED ORDER — ACETAMINOPHEN 650 MG RE SUPP: 650.0000 mg | Freq: Four times a day (QID) | RECTAL | Status: DC | PRN

## 2022-12-27 MED ORDER — SODIUM CHLORIDE 0.9 % IV SOLN
1.0000 g | Freq: Once | INTRAVENOUS | Status: AC
  Administered 2022-12-27: 1 g via INTRAVENOUS
  Filled 2022-12-27: qty 10

## 2022-12-27 MED ORDER — BUSPIRONE HCL 10 MG PO TABS
10.0000 mg | ORAL_TABLET | Freq: Two times a day (BID) | ORAL | Status: DC
  Administered 2022-12-27 – 2023-01-16 (×40): 10 mg via ORAL
  Filled 2022-12-27 (×40): qty 1

## 2022-12-27 MED ORDER — TRAZODONE HCL 50 MG PO TABS
25.0000 mg | ORAL_TABLET | Freq: Every day | ORAL | Status: DC
  Administered 2022-12-27 – 2023-01-15 (×20): 25 mg via ORAL
  Filled 2022-12-27 (×20): qty 1

## 2022-12-27 MED ORDER — CYCLOBENZAPRINE HCL 10 MG PO TABS
10.0000 mg | ORAL_TABLET | Freq: Three times a day (TID) | ORAL | Status: DC | PRN
  Administered 2022-12-27 – 2023-01-14 (×14): 10 mg via ORAL
  Filled 2022-12-27 (×14): qty 1

## 2022-12-27 MED ORDER — SOTALOL HCL 80 MG PO TABS
80.0000 mg | ORAL_TABLET | Freq: Two times a day (BID) | ORAL | Status: DC
  Administered 2022-12-27 – 2023-01-16 (×38): 80 mg via ORAL
  Filled 2022-12-27 (×41): qty 1

## 2022-12-27 MED ORDER — APIXABAN 5 MG PO TABS
5.0000 mg | ORAL_TABLET | Freq: Two times a day (BID) | ORAL | Status: DC
  Administered 2022-12-27 – 2022-12-30 (×6): 5 mg via ORAL
  Filled 2022-12-27 (×6): qty 1

## 2022-12-27 MED ORDER — ONDANSETRON HCL 4 MG/2ML IJ SOLN
4.0000 mg | Freq: Four times a day (QID) | INTRAMUSCULAR | Status: DC | PRN
  Administered 2022-12-30: 4 mg via INTRAVENOUS
  Filled 2022-12-27: qty 2

## 2022-12-27 MED ORDER — SODIUM CHLORIDE 0.9 % IV SOLN
1.0000 g | INTRAVENOUS | Status: DC
  Administered 2022-12-28: 1 g via INTRAVENOUS
  Filled 2022-12-27 (×2): qty 10

## 2022-12-27 MED ORDER — ONDANSETRON HCL 4 MG/2ML IJ SOLN
4.0000 mg | Freq: Once | INTRAMUSCULAR | Status: AC
  Administered 2022-12-27: 4 mg via INTRAVENOUS
  Filled 2022-12-27: qty 2

## 2022-12-27 MED ORDER — VANCOMYCIN HCL 2000 MG/400ML IV SOLN
2000.0000 mg | Freq: Once | INTRAVENOUS | Status: AC
  Administered 2022-12-27: 2000 mg via INTRAVENOUS
  Filled 2022-12-27 (×2): qty 400

## 2022-12-27 MED ORDER — VANCOMYCIN HCL IN DEXTROSE 1-5 GM/200ML-% IV SOLN
1000.0000 mg | Freq: Two times a day (BID) | INTRAVENOUS | Status: DC
  Administered 2022-12-28: 1000 mg via INTRAVENOUS
  Filled 2022-12-27: qty 200

## 2022-12-27 MED ORDER — SODIUM CHLORIDE 0.9% FLUSH
3.0000 mL | Freq: Two times a day (BID) | INTRAVENOUS | Status: DC
  Administered 2022-12-27 – 2023-01-16 (×39): 3 mL via INTRAVENOUS

## 2022-12-27 MED ORDER — MORPHINE SULFATE (PF) 4 MG/ML IV SOLN
4.0000 mg | Freq: Once | INTRAVENOUS | Status: AC
  Administered 2022-12-27: 4 mg via INTRAVENOUS
  Filled 2022-12-27: qty 1

## 2022-12-27 MED ORDER — DULOXETINE HCL 30 MG PO CPEP
90.0000 mg | ORAL_CAPSULE | Freq: Every day | ORAL | Status: DC
  Administered 2022-12-28 – 2023-01-16 (×20): 90 mg via ORAL
  Filled 2022-12-27 (×20): qty 3

## 2022-12-27 MED ORDER — OXYCODONE HCL 5 MG PO TABS
5.0000 mg | ORAL_TABLET | Freq: Four times a day (QID) | ORAL | Status: DC | PRN
  Administered 2022-12-27 – 2023-01-01 (×12): 5 mg via ORAL
  Filled 2022-12-27 (×12): qty 1

## 2022-12-27 MED ORDER — CLOPIDOGREL BISULFATE 75 MG PO TABS
75.0000 mg | ORAL_TABLET | Freq: Every day | ORAL | Status: DC
  Administered 2022-12-28 – 2023-01-16 (×20): 75 mg via ORAL
  Filled 2022-12-27 (×20): qty 1

## 2022-12-27 MED ORDER — INSULIN ASPART 100 UNIT/ML IJ SOLN: 0.0000 [IU] | Freq: Three times a day (TID) | INTRAMUSCULAR | Status: DC

## 2022-12-27 MED ORDER — POLYETHYLENE GLYCOL 3350 17 G PO PACK
17.0000 g | PACK | Freq: Every day | ORAL | Status: DC | PRN
  Administered 2023-01-05 – 2023-01-07 (×3): 17 g via ORAL
  Filled 2022-12-27 (×6): qty 1

## 2022-12-27 MED ORDER — HYDROMORPHONE HCL 1 MG/ML IJ SOLN
0.5000 mg | INTRAMUSCULAR | Status: DC | PRN
  Administered 2022-12-27 – 2023-01-15 (×29): 1 mg via INTRAVENOUS
  Filled 2022-12-27 (×29): qty 1

## 2022-12-27 MED ORDER — ROSUVASTATIN CALCIUM 10 MG PO TABS
10.0000 mg | ORAL_TABLET | Freq: Every day | ORAL | Status: DC
  Administered 2022-12-27 – 2023-01-15 (×20): 10 mg via ORAL
  Filled 2022-12-27 (×20): qty 1

## 2022-12-27 MED ORDER — INSULIN GLARGINE-YFGN 100 UNIT/ML ~~LOC~~ SOLN
35.0000 [IU] | Freq: Every day | SUBCUTANEOUS | Status: DC
  Administered 2022-12-27 – 2022-12-29 (×3): 35 [IU] via SUBCUTANEOUS
  Filled 2022-12-27 (×3): qty 0.35

## 2022-12-27 MED ORDER — ASPIRIN 81 MG PO TBEC
81.0000 mg | DELAYED_RELEASE_TABLET | Freq: Every day | ORAL | Status: DC
  Administered 2022-12-28 – 2023-01-16 (×20): 81 mg via ORAL
  Filled 2022-12-27 (×20): qty 1

## 2022-12-27 MED ORDER — MELATONIN 5 MG PO TABS
5.0000 mg | ORAL_TABLET | Freq: Every day | ORAL | Status: DC
  Administered 2022-12-27 – 2023-01-15 (×20): 5 mg via ORAL
  Filled 2022-12-27 (×20): qty 1

## 2022-12-27 MED ORDER — LISINOPRIL 20 MG PO TABS: 40.0000 mg | ORAL_TABLET | Freq: Every day | ORAL | Status: DC

## 2022-12-27 MED ORDER — ALBUTEROL SULFATE (2.5 MG/3ML) 0.083% IN NEBU
2.5000 mg | INHALATION_SOLUTION | Freq: Four times a day (QID) | RESPIRATORY_TRACT | Status: DC | PRN
  Administered 2023-01-03 – 2023-01-07 (×2): 2.5 mg via RESPIRATORY_TRACT
  Filled 2022-12-27 (×2): qty 3

## 2022-12-27 MED ORDER — HYDROXYZINE HCL 25 MG PO TABS
25.0000 mg | ORAL_TABLET | Freq: Three times a day (TID) | ORAL | Status: DC | PRN
  Administered 2022-12-27 – 2023-01-16 (×32): 25 mg via ORAL
  Filled 2022-12-27 (×32): qty 1

## 2022-12-27 MED ORDER — AMLODIPINE BESYLATE 5 MG PO TABS: 5.0000 mg | ORAL_TABLET | Freq: Every day | ORAL | Status: DC

## 2022-12-27 MED ORDER — POLYVINYL ALCOHOL 1.4 % OP SOLN
2.0000 [drp] | Freq: Two times a day (BID) | OPHTHALMIC | Status: DC
  Administered 2022-12-27 – 2023-01-16 (×36): 2 [drp] via OPHTHALMIC
  Filled 2022-12-27 (×4): qty 15

## 2022-12-27 MED ORDER — PREGABALIN 50 MG PO CAPS
50.0000 mg | ORAL_CAPSULE | Freq: Three times a day (TID) | ORAL | Status: DC
  Administered 2022-12-27 – 2023-01-16 (×59): 50 mg via ORAL
  Filled 2022-12-27 (×59): qty 1

## 2022-12-27 MED ORDER — ACETAMINOPHEN 325 MG PO TABS
650.0000 mg | ORAL_TABLET | Freq: Four times a day (QID) | ORAL | Status: DC | PRN
  Administered 2022-12-29 – 2023-01-07 (×2): 650 mg via ORAL
  Filled 2022-12-27 (×5): qty 2

## 2022-12-27 MED ORDER — VANCOMYCIN HCL 500 MG/100ML IV SOLN
500.0000 mg | Freq: Once | INTRAVENOUS | Status: AC
  Administered 2022-12-27: 500 mg via INTRAVENOUS
  Filled 2022-12-27 (×2): qty 100

## 2022-12-27 MED ORDER — TAMSULOSIN HCL 0.4 MG PO CAPS
0.4000 mg | ORAL_CAPSULE | Freq: Every day | ORAL | Status: DC
  Administered 2022-12-27 – 2023-01-15 (×20): 0.4 mg via ORAL
  Filled 2022-12-27 (×20): qty 1

## 2022-12-27 NOTE — ED Triage Notes (Addendum)
Pt comes via EMS from [facility] with c/o leg pain. Pt needs clean dressing to wound. EMS reports drainage. Pt has left foot in dressing. EMs denies any fevers per pt.  Top of foot and back of calf with drainage. Nursing home wrapped prior to ems arrival and did wound care. Pt states 2-3 days it started with spasm pain.  Pt does have hx of diabetes  BP-144/81 RR-11 HR-81 ETco2 35 98% RA  97.4 CBG-194

## 2022-12-27 NOTE — Assessment & Plan Note (Signed)
-   A1c pending - SSI, moderate

## 2022-12-27 NOTE — Assessment & Plan Note (Signed)
Continue home Eliquis. 

## 2022-12-27 NOTE — Assessment & Plan Note (Signed)
-   Continue home regimen 

## 2022-12-27 NOTE — ED Provider Notes (Signed)
[facility] Provider Note    Event Date/Time   First MD Initiated Contact with Patient 12/27/22 1028     (approximate)  History   Chief Complaint: Leg infection  HPI  Eric Forbes is a 67 y.o. male with a past medical history of atrial fibrillation, diabetes, hypertension, MI, prior CVA last November who presents to the emergency department from his nursing facility for worsening lower leg edema blistering and redness.  According to the patient and report he has had redness and swelling of the legs left much more so than the right.  As of a couple days ago he has begun experiencing large blisters in the left lower extremity in addition to the erythema tenderness and warmth.  They were concerned so they sent to the emergency department for evaluation.  Patient reported a fever at his nursing facility this morning although afebrile currently 97.5.  Physical Exam   Triage Vital Signs: ED Triage Vitals  Encounter Vitals Group     BP 12/27/22 0931 139/89     Systolic BP Percentile --      Diastolic BP Percentile --      Pulse Rate 12/27/22 0931 83     Resp 12/27/22 0931 16     Temp 12/27/22 0931 (!) 97.5 F (36.4 C)     Temp Source 12/27/22 0931 Oral     SpO2 12/27/22 1121 98 %     Weight 12/27/22 0931 255 lb 1.2 oz (115.7 kg)     Height 12/27/22 0931 5\' 10"  (1.778 m)     Head Circumference --      Peak Flow --      Pain Score 12/27/22 0929 10     Pain Loc --      Pain Education --      Exclude from Growth Chart --     Most recent vital signs: Vitals:   12/27/22 0931 12/27/22 1121  BP: 139/89 (!) 132/99  Pulse: 83 89  Resp: 16 20  Temp: (!) 97.5 F (36.4 C)   SpO2:  98%    General: Awake, no distress.  CV:  Good peripheral perfusion.  Regular rate and rhythm  Resp:  Normal effort.  Equal breath sounds bilaterally.  Abd:  No distention.  Soft, nontender.  No rebound or guarding. Other:  Mild edema the right lower extremity with mild  erythema.  Moderate edema of the left lower extremity with several large bullae as well as ruptured bullae present erythema and tenderness to palpation.   ED Results / Procedures / Treatments   MEDICATIONS ORDERED IN ED: Medications  cefTRIAXone (ROCEPHIN) 1 g in sodium chloride 0.9 % 100 mL IVPB (has no administration in time range)  morphine (PF) 4 MG/ML injection 4 mg (has no administration in time range)  ondansetron (ZOFRAN) injection 4 mg (has no administration in time range)     IMPRESSION / MDM / ASSESSMENT AND PLAN / ED COURSE  I reviewed the triage vital signs and the nursing notes.  Patient's presentation is most consistent with acute presentation with potential threat to life or bodily function.  Patient presents to the emergency department for worsening pain redness swelling of his left lower extremity now with large bullae present.  Examination is most consistent with cellulitis.  Given the unilateral symptoms will obtain an ultrasound to rule out DVT we will check labs including blood cultures.  Will start the patient on IV antibiotics.  Patient's labs have resulted showing a reassuring  CBC with a normal white blood cell count, chemistry is reassuring and lactic acid of 1.2.  Vital signs are reassuring.  Exam most consistent with cellulitis DVT ultrasound pending.  Ultrasound negative for DVT.  We will admit to the hospital service for left lower extremity cellulitis.  FINAL CLINICAL IMPRESSION(S) / ED DIAGNOSES   Left lower extremity cellulitis    Note:  This document was prepared using Dragon voice recognition software and may include unintentional dictation errors.   Minna Antis, MD 12/27/22 [phone]

## 2022-12-27 NOTE — H&P (Signed)
History and Physical    Patient: Eric Forbes UUV:[id number] DOB: 1955-08-02 DOA: 12/27/2022 DOS: the patient was seen and examined on 12/27/2022 PCP: [facility]  Patient coming from: SNF  Chief Complaint:  Chief Complaint  Patient presents with   Leg infection   HPI: Eric Forbes is a 67 y.o. male with medical history significant of atrial fibrillation on Eliquis, CVA, type 2 diabetes, hypertension, CAD, who presents to the ED due to leg infection.  Eric Forbes states that for the last 5-6 days, Eric Forbes has been experiencing increased pain and redness in his left lower extremity. His facility has been dressing his wounds but it has not helped at all. Yesterday, Eric Forbes endorses a fever. Otherwise, Eric Forbes denies any recent falls, nausea, vomiting, abdominal pain, chest pain or SOB.   ED course: On arrival to the ED, patient was normotensive at 139/89 with heart rate of 83.  Eric Forbes was saturating at 98% on room air.  Eric Forbes was afebrile at 97.5. Initial workup notable for WBC of 10.0, hemoglobin 13.6, glucose of 183, creatinine 0.88 with GFR above 60.  Lactic acid 1.2.  Lower extremity Doppler negative for DVT.  Patient started on Zofran, morphine and Rocephin.  [facility] contacted for admission.  Review of Systems: As mentioned in the history of present illness. All other systems reviewed and are negative.  Past Medical History:  Diagnosis Date   Afib (HCC)    Diabetes mellitus without complication (HCC)    Hypertension    Myocardial infarction [facility])    Stroke [facility])    History reviewed. No pertinent surgical history. Social History:  reports that Eric Forbes has been smoking cigarettes. Eric Forbes has a 15 pack-year smoking history. Eric Forbes has never used smokeless tobacco. Eric Forbes reports that Eric Forbes does not currently use alcohol. Eric Forbes reports that Eric Forbes does not use drugs.  Allergies  Allergen Reactions   Bee Venom Anaphylaxis    Family History  Problem Relation Age of Onset   Hypertension Mother    Hypertension Father      Prior to Admission medications   Medication Sig Start Date End Date Taking? Authorizing Provider  amLODipine (NORVASC) 5 MG tablet Take 5 mg by mouth daily. 09/13/19   [provider]  apixaban (ELIQUIS) 5 MG TABS tablet Take 5 mg by mouth 2 (two) times daily.    [provider]  aspirin EC 81 MG tablet Take 81 mg by mouth daily.    [provider]  atorvastatin (LIPITOR) 40 MG tablet Take 1 tablet (40 mg total) by mouth daily. 09/19/19   Marguerita Merles Latif, DO  cyclobenzaprine (FLEXERIL) 10 MG tablet Take 1 tablet (10 mg total) by mouth 3 (three) times daily as needed for muscle spasms. 11/17/22   Dionne Bucy, MD  DAYVIGO 10 MG TABS Take 1 tablet by mouth at bedtime. 09/02/19   [provider]  furosemide (LASIX) 40 MG tablet Take 40 mg by mouth daily. 09/13/19   [provider]  HYDROcodone-acetaminophen (NORCO) 10-325 MG tablet Take 1 tablet by mouth 4 (four) times daily as needed for pain. 09/02/19   [provider]  isosorbide mononitrate (IMDUR) 30 MG 24 hr tablet Take 1 tablet (30 mg total) by mouth daily. 09/19/19   Sheikh, Omair Latif, DO  levorphanol (LEVODROMORAN) 2 MG tablet Take 2 mg by mouth 2 (two) times daily as needed for pain.  09/01/19   [provider]  LISINOPRIL PO Take by mouth.    [provider]  nicotine (NICODERM  CQ - DOSED IN MG/24 HOURS) 21 mg/24hr patch Place 1 patch (21 mg total) onto the skin daily. 09/20/19   Marguerita Merles Latif, DO  nitroGLYCERIN (NITROSTAT) 0.4 MG SL tablet Place 1 tablet (0.4 mg total) under the tongue every 5 (five) minutes as needed for chest pain. 09/19/19   Sheikh, Omair Latif, DO  temazepam (RESTORIL) 30 MG capsule Take 30 mg by mouth at bedtime as needed for sleep.    [provider]  UNKNOWN TO PATIENT Metoprolol cannot verify dose only 1/2 a tab qd    [provider]    Physical Exam: Vitals:   12/27/22 1121 12/27/22 1402 12/27/22 1403 12/27/22  1537  BP: (!) 132/99  123/86 (!) 138/99  Pulse: 89  71 77  Resp: 20  20 18   Temp:  97.6 F (36.4 C) 97.6 F (36.4 C) (!) 97.5 F (36.4 C)  TempSrc:  Oral Oral   SpO2: 98%  98% 95%  Weight:      Height:       Physical Exam Vitals and nursing note reviewed.  Constitutional:      General: Eric Forbes is not in acute distress.    Appearance: Eric Forbes is obese.  HENT:     Head: Normocephalic and atraumatic.     Mouth/Throat:     Mouth: Mucous membranes are moist.     Pharynx: Oropharynx is clear.  Eyes:     Conjunctiva/sclera: Conjunctivae normal.     Pupils: Pupils are equal, round, and reactive to light.  Cardiovascular:     Rate and Rhythm: Normal rate. Rhythm irregular.     Heart sounds: No murmur heard. Pulmonary:     Effort: Pulmonary effort is normal. No respiratory distress.     Breath sounds: Normal breath sounds. No wheezing, rhonchi or rales.  Abdominal:     General: Abdomen is protuberant. Bowel sounds are normal. There is no distension.     Palpations: Abdomen is soft.     Tenderness: There is no abdominal tenderness. There is no guarding.  Musculoskeletal:     Right lower leg: No edema.  Skin:    General: Skin is warm.     Findings: Erythema and rash present. Rash is vesicular.     Comments: Significant erythema and tenderness to palpation of the left lower extremity and foot, predominantly on the dorsal and anterior aspect. Please see pictures below.   Neurological:     Mental Status: Eric Forbes is alert and oriented to person, place, and time. Mental status is at baseline.  Psychiatric:        Mood and Affect: Mood normal.        Behavior: Behavior normal.        Data Reviewed: CBC with WBC of 10.0, hemoglobin of 13.6, and platelets of 168 BMP with sodium of 136, potassium 4.1, bicarb 28, glucose 183, BUN 17, creatinine 0.88 with GFR above 60 Lactic acid 1.2  US Venous Img Lower Unilateral Left  Result Date: 12/27/2022 CLINICAL DATA:  Swelling, pain EXAM: LEFT LOWER  EXTREMITY VENOUS DOPPLER ULTRASOUND TECHNIQUE: Gray-scale sonography with compression, as well as color and duplex ultrasound, were performed to evaluate the deep venous system(s) from the level of the common femoral vein through the popliteal and proximal calf veins. COMPARISON:  Chest XR, 09/20/2019. FINDINGS: VENOUS Normal compressibility of the common femoral, superficial femoral, and popliteal veins, as well as the visualized calf veins. Visualized portions of profunda femoral vein and great saphenous vein unremarkable. No filling defects  to suggest DVT on grayscale or color Doppler imaging. Doppler waveforms show normal direction of venous flow, normal respiratory plasticity and response to augmentation. Limited views of the contralateral common femoral vein are unremarkable. OTHER No evidence of superficial thrombophlebitis or abnormal fluid collection. Subcutaneous edema of the imaged distal LEFT lower extremity. Limitations: Patient body habitus IMPRESSION: No evidence of femoropopliteal DVT or superficial thrombophlebitis within the LEFT lower extremity. Roanna Banning, MD [facility] Electronically Signed   By: Roanna Banning M.D.   On: 12/27/2022 13:55    There are no new results to review at this time.  Assessment and Plan:  * Lower extremity cellulitis Patient is presenting with extensive left lower extremity cellulitis with overlying blisters, one large blister on the foot that has opened  Given extensive nature, will admit for IV antibiotics.  No evidence of sepsis at this time.  - Continue Rocephin - Given bullous nature, will add on vancomycin - Wound care consult  Essential hypertension - Continue home regimen  Type 2 diabetes mellitus (HCC) - A1c pending - SSI, moderate  A-fib (HCC) - Continue home Eliquis  Advance Care Planning:   Code Status: Full Code   Consults: None  Family Communication: No family at bedside.    Severity of Illness: The appropriate patient status for this patient is OBSERVATION. Observation status is judged to be reasonable and necessary in order to provide the required intensity of service to ensure the patient's safety. The patient's presenting symptoms, physical exam findings, and initial radiographic and laboratory data in the context of their medical condition is felt to place them at decreased risk for further clinical deterioration. Furthermore, it is anticipated that the patient will be medically stable for discharge from the hospital within 2 midnights of admission.   Author: Verdene Lennert, MD 12/27/2022 4:19 PM  For on call review [email or web address].

## 2022-12-27 NOTE — Assessment & Plan Note (Addendum)
Patient is presenting with extensive left lower extremity cellulitis with overlying blisters, one large blister on the foot that has opened  Given extensive nature, will admit for IV antibiotics.  No evidence of sepsis at this time.  - Continue Rocephin - Given bullous nature, will add on vancomycin - Wound care consult

## 2022-12-27 NOTE — ED Notes (Signed)
Dressing removed, pt has an open blister to the top of his left foot, pt has a closed large blister to the front of his left shin and a small blister under the left knee, wet saline gauze applied to the top of his left foot

## 2022-12-27 NOTE — Consult Note (Signed)
Pharmacy Antibiotic Note  Eric Forbes is a 67 y.o. male admitted on 12/27/2022 with cellulitis.  Pharmacy has been consulted for vancomycin dosing.  Plan: Give vancomycin 2500 mg IV x 1 loading dose, then start vancomycin 1 gram IV every 12 hours. Estimated AUC 472, Cmin 13.2 IBW, Scr 0.88, Vd 0.5 (BMI 36) Vancomycin levels at steady state or as clinically indicated Follow renal function for adjustments  Height: 5\' 10"  (177.8 cm) Weight: 115.7 kg (255 lb 1.2 oz) IBW/kg (Calculated) : 73  Temp (24hrs), Avg:97.6 F (36.4 C), Min:97.5 F (36.4 C), Max:97.6 F (36.4 C)  Recent Labs  Lab 12/27/22 0933  WBC 10.0  CREATININE 0.88  LATICACIDVEN 1.2    Estimated Creatinine Clearance: 102.4 mL/min (by C-G formula based on SCr of 0.88 mg/dL).    Allergies  Allergen Reactions   Bee Venom Anaphylaxis    Antimicrobials this admission: vancomycin 8/16 >>  Ceftriaxone 8/16 x 1  Dose adjustments this admission: N/A  Microbiology results: 8/16 BCx: pending  Thank you for allowing pharmacy to be a part of this patient's care.  Barrie Folk, PharmD 12/27/2022 2:56 PM

## 2022-12-27 NOTE — ED Notes (Addendum)
ED TO INPATIENT HANDOFF REPORT  ED Nurse Name and Phone #: Elijah Birk  S Name/Age/Gender Eric Forbes 67 y.o. male Room/Bed: ED13A/ED13A  Code Status   Code Status: Full Code  Home/SNF/Other Inpatient, lives in a SNF Patient oriented to: self, place, time, and situation Is this baseline? Yes   Triage Complete: Triage complete  Chief Complaint Lower extremity cellulitis [L03.119]  Triage Note Pt comes via EMS from [facility] with c/o leg pain. Pt needs clean dressing to wound. EMS reports drainage. Pt has left foot in dressing. EMs denies any fevers per pt.  Top of foot and back of calf with drainage. Nursing home wrapped prior to ems arrival and did wound care. Pt states 2-3 days it started with spasm pain.  Pt does have hx of diabetes  BP-144/81 RR-11 HR-81 ETco2 35 98% RA  97.4 CBG-194    Allergies Allergies  Allergen Reactions   Bee Venom Anaphylaxis    Level of Care/Admitting Diagnosis ED Disposition     ED Disposition  Admit   Condition  --   Comment  Hospital Area: [facility] [[id number]]  Level of Care: Med-Surg [16]  Covid Evaluation: Asymptomatic - no recent exposure (last 10 days) testing not required  Diagnosis: Lower extremity cellulitis [161096]  Admitting Physician: Verdene Lennert [[id number]]  Attending Physician: Verdene Lennert [[id number]]          B Medical/Surgery History Past Medical History:  Diagnosis Date   Afib (HCC)    Diabetes mellitus without complication (HCC)    Hypertension    Myocardial infarction (HCC)    Stroke (HCC)    History reviewed. No pertinent surgical history.   A IV Location/Drains/Wounds Patient Lines/Drains/Airways Status     Active Line/Drains/Airways     Name Placement date Placement time Site Days   Peripheral IV 12/27/22 20 G 1" Right Antecubital 12/27/22  1301  Antecubital  less than 1            Intake/Output Last 24 hours  Intake/Output  Summary (Last 24 hours) at 12/27/2022 1458 Last data filed at 12/27/2022 1252 Gross per 24 hour  Intake 100 ml  Output --  Net 100 ml    Labs/Imaging Results for orders placed or performed during the hospital encounter of 12/27/22 (from the past 48 hour(s))  CBC     Status: Abnormal   Collection Time: 12/27/22  9:33 AM  Result Value Ref Range   WBC 10.0 4.0 - 10.5 K/uL   RBC 5.47 4.22 - 5.81 MIL/uL   Hemoglobin 13.6 13.0 - 17.0 g/dL   HCT 29.5 (L) 62.1 - 30.8 %   MCV 70.2 (L) 80.0 - 100.0 fL   MCH 24.9 (L) 26.0 - 34.0 pg   MCHC 35.4 30.0 - 36.0 g/dL   RDW 65.7 (H) 84.6 - 96.2 %   Platelets 168 150 - 400 K/uL   nRBC 0.3 (H) 0.0 - 0.2 %    Comment: Performed at [facility], [address], [place], [place] [address]  Basic metabolic panel     Status: Abnormal   Collection Time: 12/27/22  9:33 AM  Result Value Ref Range   Sodium 136 135 - 145 mmol/L   Potassium 4.1 3.5 - 5.1 mmol/L   Chloride 100 98 - 111 mmol/L   CO2 28 22 - 32 mmol/L   Glucose, Bld 183 (H) 70 - 99 mg/dL    Comment: Glucose reference range applies only to samples taken after fasting for  at least 8 hours.   BUN 17 8 - 23 mg/dL   Creatinine, Ser 1.61 0.61 - 1.24 mg/dL   Calcium 9.2 8.9 - 09.6 mg/dL   GFR, Estimated >04 >54 mL/min    Comment: (NOTE) Calculated using the CKD-EPI Creatinine Equation (2021)    Anion gap 8 5 - 15    Comment: Performed at [facility], [address] [place], [place] [address]  Lactic acid, plasma     Status: None   Collection Time: 12/27/22  9:33 AM  Result Value Ref Range   Lactic Acid, Venous 1.2 0.5 - 1.9 mmol/L    Comment: Performed at [facility], [address], [place], [place] [address]   US Venous Img Lower Unilateral Left  Result Date: 12/27/2022 CLINICAL DATA:  Swelling, pain EXAM: LEFT LOWER EXTREMITY VENOUS DOPPLER ULTRASOUND TECHNIQUE: Gray-scale sonography with compression, as well as color and duplex ultrasound, were performed to  evaluate the deep venous system(s) from the level of the common femoral vein through the popliteal and proximal calf veins. COMPARISON:  Chest XR, 09/20/2019. FINDINGS: VENOUS Normal compressibility of the common femoral, superficial femoral, and popliteal veins, as well as the visualized calf veins. Visualized portions of profunda femoral vein and great saphenous vein unremarkable. No filling defects to suggest DVT on grayscale or color Doppler imaging. Doppler waveforms show normal direction of venous flow, normal respiratory plasticity and response to augmentation. Limited views of the contralateral common femoral vein are unremarkable. OTHER No evidence of superficial thrombophlebitis or abnormal fluid collection. Subcutaneous edema of the imaged distal LEFT lower extremity. Limitations: Patient body habitus IMPRESSION: No evidence of femoropopliteal DVT or superficial thrombophlebitis within the LEFT lower extremity. Roanna Banning, MD [facility] Electronically Signed   By: Roanna Banning M.D.   On: 12/27/2022 13:55    Pending Labs Unresulted Labs (From admission, onward)     Start     Ordered   12/28/22 0500  CBC  Tomorrow morning,   STAT        12/27/22 1451   12/27/22 1447  HIV Antibody (routine testing w rflx)  (HIV Antibody (Routine testing w reflex) panel)  Once,   R        12/27/22 1451   12/27/22 1126  Blood culture (routine x 2)  BLOOD CULTURE X 2,   STAT      12/27/22 1126            Vitals/Pain Today's Vitals   12/27/22 1243 12/27/22 1301 12/27/22 1402 12/27/22 1403  BP:    123/86  Pulse:    71  Resp:    20  Temp:   97.6 F (36.4 C) 97.6 F (36.4 C)  TempSrc:   Oral Oral  SpO2:    98%  Weight:      Height:      PainSc: 9  5   5      Isolation Precautions No active isolations  Medications Medications  sodium chloride flush (NS) 0.9 % injection 3 mL (3 mLs Intravenous Given 12/27/22 1452)  acetaminophen  (TYLENOL) tablet 650 mg (has no administration in time range)    Or  acetaminophen (TYLENOL) suppository 650 mg (has no administration in time range)  polyethylene glycol (MIRALAX / GLYCOLAX) packet 17 g (has no administration in time range)  oxyCODONE (Oxy IR/ROXICODONE) immediate release tablet 5 mg (has no administration in time range)  HYDROmorphone (DILAUDID) injection 0.5-1 mg (has no administration in time range)  ondansetron (ZOFRAN) tablet 4 mg (has no administration in time range)    Or  ondansetron (ZOFRAN) injection 4 mg (has no administration in time range)  vancomycin (VANCOREADY) IVPB 2000 mg/400 mL (has no administration in time range)  vancomycin (VANCOREADY) IVPB 500 mg/100 mL (has no administration in time range)  cefTRIAXone (ROCEPHIN) 1 g in sodium chloride 0.9 % 100 mL IVPB (0 g Intravenous Stopped 12/27/22 1252)  morphine (PF) 4 MG/ML injection 4 mg (4 mg Intravenous Given 12/27/22 1216)  ondansetron (ZOFRAN) injection 4 mg (4 mg Intravenous Given 12/27/22 1219)    Mobility walks with device, Walker, and 1 person assist.     Focused Assessments Cardiac Assessment Handoff:  Cardiac Rhythm: Normal sinus rhythm No results found for: "CKTOTAL", "CKMB", "CKMBINDEX", "TROPONINI" Lab Results  Component Value Date   DDIMER <0.27 09/19/2019   Does the Patient currently have chest pain? No   , Neuro Assessment Handoff:  Swallow screen pass? N/A Cardiac Rhythm: Normal sinus rhythm       Neuro Assessment: Within Defined Limits Neuro Checks:      Has TPA been given? No If patient is a Neuro Trauma and patient is going to OR before floor call report to 4N Charge nurse: [phone] or [phone]  , Renal Assessment Handoff:  Hemodialysis Schedule: N/A Last Hemodialysis date and time: N/A   Restricted appendage: left arm from stroke Hx and left leg d/t pain/blisters in leg  , Pulmonary Assessment Handoff:  Lung sounds:   O2 Device: Room Air       R Recommendations: See Admitting Provider Note  Additional Notes: Pt is compliant and very accomodating to nursing staff. He obeys commands.Pain score of a 5/10 is improvement from admission with pain meds, see MAR.

## 2022-12-28 DIAGNOSIS — Z79899 Other long term (current) drug therapy: Secondary | ICD-10-CM | POA: Diagnosis not present

## 2022-12-28 DIAGNOSIS — Z6836 Body mass index (BMI) 36.0-36.9, adult: Secondary | ICD-10-CM | POA: Diagnosis not present

## 2022-12-28 DIAGNOSIS — I252 Old myocardial infarction: Secondary | ICD-10-CM | POA: Diagnosis not present

## 2022-12-28 DIAGNOSIS — I69354 Hemiplegia and hemiparesis following cerebral infarction affecting left non-dominant side: Secondary | ICD-10-CM | POA: Diagnosis not present

## 2022-12-28 DIAGNOSIS — L03116 Cellulitis of left lower limb: Principal | ICD-10-CM | POA: Diagnosis present

## 2022-12-28 DIAGNOSIS — I2510 Atherosclerotic heart disease of native coronary artery without angina pectoris: Secondary | ICD-10-CM | POA: Diagnosis present

## 2022-12-28 DIAGNOSIS — F32A Depression, unspecified: Secondary | ICD-10-CM | POA: Diagnosis present

## 2022-12-28 DIAGNOSIS — R252 Cramp and spasm: Secondary | ICD-10-CM | POA: Diagnosis not present

## 2022-12-28 DIAGNOSIS — E1165 Type 2 diabetes mellitus with hyperglycemia: Secondary | ICD-10-CM | POA: Diagnosis present

## 2022-12-28 DIAGNOSIS — Z7901 Long term (current) use of anticoagulants: Secondary | ICD-10-CM | POA: Diagnosis not present

## 2022-12-28 DIAGNOSIS — N400 Enlarged prostate without lower urinary tract symptoms: Secondary | ICD-10-CM | POA: Diagnosis present

## 2022-12-28 DIAGNOSIS — I480 Paroxysmal atrial fibrillation: Secondary | ICD-10-CM | POA: Diagnosis present

## 2022-12-28 DIAGNOSIS — R531 Weakness: Secondary | ICD-10-CM | POA: Diagnosis present

## 2022-12-28 DIAGNOSIS — F17210 Nicotine dependence, cigarettes, uncomplicated: Secondary | ICD-10-CM | POA: Diagnosis present

## 2022-12-28 DIAGNOSIS — R7989 Other specified abnormal findings of blood chemistry: Secondary | ICD-10-CM | POA: Diagnosis not present

## 2022-12-28 DIAGNOSIS — K5900 Constipation, unspecified: Secondary | ICD-10-CM | POA: Diagnosis not present

## 2022-12-28 DIAGNOSIS — Z7982 Long term (current) use of aspirin: Secondary | ICD-10-CM | POA: Diagnosis not present

## 2022-12-28 DIAGNOSIS — R0602 Shortness of breath: Secondary | ICD-10-CM | POA: Diagnosis not present

## 2022-12-28 DIAGNOSIS — R600 Localized edema: Secondary | ICD-10-CM | POA: Diagnosis present

## 2022-12-28 DIAGNOSIS — I10 Essential (primary) hypertension: Secondary | ICD-10-CM | POA: Diagnosis present

## 2022-12-28 DIAGNOSIS — Z8249 Family history of ischemic heart disease and other diseases of the circulatory system: Secondary | ICD-10-CM | POA: Diagnosis not present

## 2022-12-28 DIAGNOSIS — E538 Deficiency of other specified B group vitamins: Secondary | ICD-10-CM | POA: Diagnosis present

## 2022-12-28 DIAGNOSIS — E669 Obesity, unspecified: Secondary | ICD-10-CM | POA: Diagnosis present

## 2022-12-28 LAB — GLUCOSE, CAPILLARY
Glucose-Capillary: 145 mg/dL — ABNORMAL HIGH (ref 70–99)
Glucose-Capillary: 196 mg/dL — ABNORMAL HIGH (ref 70–99)
Glucose-Capillary: 167 mg/dL — ABNORMAL HIGH (ref 70–99)
Glucose-Capillary: 207 mg/dL — ABNORMAL HIGH (ref 70–99)

## 2022-12-28 LAB — BASIC METABOLIC PANEL
Anion gap: 7 (ref 5–15)
BUN: 19 mg/dL (ref 8–23)
CO2: 28 mmol/L (ref 22–32)
Calcium: 9 mg/dL (ref 8.9–10.3)
Chloride: 100 mmol/L (ref 98–111)
Creatinine, Ser: 1.36 mg/dL — ABNORMAL HIGH (ref 0.61–1.24)
GFR, Estimated: 57 mL/min — ABNORMAL LOW (ref >60)
Glucose, Bld: 153 mg/dL — ABNORMAL HIGH (ref 70–99)
Potassium: 5.1 mmol/L (ref 3.5–5.1)
Sodium: 135 mmol/L (ref 135–145)

## 2022-12-28 LAB — CBC
HCT: 37.3 % — ABNORMAL LOW (ref 39.0–52.0)
Hemoglobin: 13.2 g/dL (ref 13.0–17.0)
MCH: 24.5 pg — ABNORMAL LOW (ref 26.0–34.0)
MCHC: 35.4 g/dL (ref 30.0–36.0)
MCV: 69.3 fL — ABNORMAL LOW (ref 80.0–100.0)
Platelets: 186 10*3/uL (ref 150–400)
RBC: 5.38 MIL/uL (ref 4.22–5.81)
RDW: 16.2 % — ABNORMAL HIGH (ref 11.5–15.5)
WBC: 10.6 10*3/uL — ABNORMAL HIGH (ref 4.0–10.5)
nRBC: 0.5 % — ABNORMAL HIGH (ref 0.0–0.2)

## 2022-12-28 LAB — HIV ANTIBODY (ROUTINE TESTING W REFLEX): HIV Screen 4th Generation wRfx: NONREACTIVE

## 2022-12-28 LAB — IRON AND TIBC
Iron: 55 ug/dL (ref 45–182)
Saturation Ratios: 15 % — ABNORMAL LOW (ref 17.9–39.5)
TIBC: 374 ug/dL (ref 250–450)
UIBC: 319 ug/dL

## 2022-12-28 LAB — PHOSPHORUS: Phosphorus: 5.5 mg/dL — ABNORMAL HIGH (ref 2.5–4.6)

## 2022-12-28 LAB — MAGNESIUM: Magnesium: 2 mg/dL (ref 1.7–2.4)

## 2022-12-28 LAB — VITAMIN D 25 HYDROXY (VIT D DEFICIENCY, FRACTURES): Vit D, 25-Hydroxy: 38.34 ng/mL (ref 30–100)

## 2022-12-28 LAB — VITAMIN B12: Vitamin B-12: 378 pg/mL (ref 180–914)

## 2022-12-28 LAB — FOLATE: Folate: 18 ng/mL (ref >5.9)

## 2022-12-28 MED ORDER — VANCOMYCIN VARIABLE DOSE PER UNSTABLE RENAL FUNCTION (PHARMACIST DOSING): Status: DC

## 2022-12-28 MED ORDER — LISINOPRIL 20 MG PO TABS
20.0000 mg | ORAL_TABLET | Freq: Every day | ORAL | Status: DC
  Administered 2022-12-29 – 2023-01-16 (×19): 20 mg via ORAL
  Filled 2022-12-28 (×19): qty 1

## 2022-12-28 MED ORDER — FUROSEMIDE 10 MG/ML IJ SOLN
40.0000 mg | Freq: Two times a day (BID) | INTRAMUSCULAR | Status: DC
  Administered 2022-12-28 – 2022-12-31 (×7): 40 mg via INTRAVENOUS
  Filled 2022-12-28 (×7): qty 4

## 2022-12-28 MED ORDER — POLYSACCHARIDE IRON COMPLEX 150 MG PO CAPS
150.0000 mg | ORAL_CAPSULE | Freq: Every day | ORAL | Status: DC
  Administered 2022-12-28 – 2023-01-01 (×5): 150 mg via ORAL
  Filled 2022-12-28 (×5): qty 1

## 2022-12-28 MED ORDER — SODIUM CHLORIDE 0.9 % IV SOLN: INTRAVENOUS | Status: DC | PRN

## 2022-12-28 MED ORDER — VITAMIN C 500 MG PO TABS
500.0000 mg | ORAL_TABLET | Freq: Every day | ORAL | Status: DC
  Administered 2022-12-28 – 2023-01-16 (×20): 500 mg via ORAL
  Filled 2022-12-28 (×20): qty 1

## 2022-12-28 MED ORDER — VITAMIN B-12 1000 MCG PO TABS
500.0000 ug | ORAL_TABLET | Freq: Every day | ORAL | Status: DC
  Administered 2022-12-28 – 2023-01-16 (×20): 500 ug via ORAL
  Filled 2022-12-28 (×20): qty 1

## 2022-12-28 NOTE — Evaluation (Signed)
Occupational Therapy Evaluation Patient Details Name: Eric Forbes MRN: [id number] DOB: 1956-04-11 Today's Date: 12/28/2022   History of Present Illness Pt is a 67 y/o M admitted on 12/27/22 after presenting with c/o pain & redness in LLE. Pt is being treated for LLE cellulitis. PMH: a-fib on Eliquis, CVA, DM2, HTN, CAD, MI   Clinical Impression   Eric Forbes was seen for OT evaluation this date. Prior to hospital admission, pt was mobile using RW. Pt currently requires MAX A for chair>bed squat pivot t/f. SETUP grooming bed level. Pt would benefit from skilled OT to address noted impairments and functional limitations (see below for any additional details). Upon hospital discharge, recommend OT follow up.    If plan is discharge home, recommend the following: Two people to help with walking and/or transfers;Two people to help with bathing/dressing/bathroom    Functional Status Assessment  Patient has had a recent decline in their functional status and demonstrates the ability to make significant improvements in function in a reasonable and predictable amount of time.  Equipment Recommendations   (defer)    Recommendations for Other Services       Precautions / Restrictions Precautions Precautions: Fall Restrictions Weight Bearing Restrictions: No      Mobility Bed Mobility Overal bed mobility: Needs Assistance Bed Mobility: Sit to Supine       Sit to supine: Max assist        Transfers Overall transfer level: Needs assistance Equipment used: None Transfers: Bed to chair/wheelchair/BSC     Squat pivot transfers: Max assist              Balance Overall balance assessment: Needs assistance Sitting-balance support: Feet supported, Bilateral upper extremity supported Sitting balance-Leahy Scale: Fair                                     ADL either performed or assessed with clinical judgement   ADL Overall ADL's : Needs assistance/impaired                                        General ADL Comments: MAX A for simulated BSC t/f. SETUP grooming bed level.      Pertinent Vitals/Pain Pain Assessment Pain Assessment: Faces Faces Pain Scale: Hurts even more Pain Location: LLE to touch, with movement Pain Descriptors / Indicators: Discomfort, Guarding, Grimacing, Crying Pain Intervention(s): Limited activity within patient's tolerance, Repositioned     Extremity/Trunk Assessment Upper Extremity Assessment LUE Deficits / Details: hx of CVA   Lower Extremity Assessment Lower Extremity Assessment: Generalized weakness       Communication Communication Communication: Difficulty communicating thoughts/reduced clarity of speech   Cognition Arousal: Alert Behavior During Therapy: WFL for tasks assessed/performed Overall Cognitive Status: Within Functional Limits for tasks assessed                                                  Home Living Family/patient expects to be discharged to:: Assisted living                             Home Equipment: Agricultural consultant (2 wheels)   Additional  Comments: [facility]      Prior Functioning/Environment Prior Level of Function : Needs assist             Mobility Comments: Pt reports he was ambulatory with & without RW, needed PRN assistance to complete bed mobility.          OT Problem List: Decreased strength;Decreased range of motion;Impaired balance (sitting and/or standing);Decreased activity tolerance;Decreased safety awareness      OT Treatment/Interventions: Self-care/ADL training;Therapeutic exercise;Energy conservation;DME and/or AE instruction;Therapeutic activities    OT Goals(Current goals can be found in the care plan section) Acute Rehab OT Goals Patient Stated Goal: improve pain OT Goal Formulation: With patient Time For Goal Achievement: 01/11/23 Potential to Achieve Goals: Fair ADL Goals Pt Will  Perform Grooming: with set-up;sitting;with supervision Pt Will Perform Lower Body Dressing: with mod assist;sitting/lateral leans Pt Will Transfer to Toilet: with mod assist;stand pivot transfer;bedside commode  OT Frequency: Min 1X/week    Co-evaluation              AM-PAC OT "6 Clicks" Daily Activity     Outcome Measure Help from another person eating meals?: None Help from another person taking care of personal grooming?: A Little Help from another person toileting, which includes using toliet, bedpan, or urinal?: A Lot Help from another person bathing (including washing, rinsing, drying)?: A Lot Help from another person to put on and taking off regular upper body clothing?: A Little Help from another person to put on and taking off regular lower body clothing?: A Lot 6 Click Score: 16   End of Session    Activity Tolerance: Patient tolerated treatment well Patient left: in bed;with call bell/phone within reach;with bed alarm set  OT Visit Diagnosis: Other abnormalities of gait and mobility (R26.89);Muscle weakness (generalized) (M62.81)                Time: 1610-9604 OT Time Calculation (min): 19 min Charges:  OT General Charges $OT Visit: 1 Visit OT Evaluation $OT Eval Moderate Complexity: 1 Mod  Kathie Dike, M.S. OTR/L  12/28/22, 3:45 PM  ascom [phone]

## 2022-12-28 NOTE — Plan of Care (Signed)
  Problem: Metabolic: Goal: Ability to maintain appropriate glucose levels will improve Outcome: Progressing   Problem: Clinical Measurements: Goal: Cardiovascular complication will be avoided Outcome: Progressing

## 2022-12-28 NOTE — Evaluation (Signed)
Physical Therapy Evaluation Patient Details Name: Eric Forbes MRN: [id number] DOB: Sep 26, 1955 Today's Date: 12/28/2022  History of Present Illness  Pt is a 67 y/o M admitted on 12/27/22 after presenting with c/o pain & redness in LLE. Pt is being treated for LLE cellulitis. PMH: a-fib on Eliquis, CVA, DM2, HTN, CAD, MI  Clinical Impression  Pt seen for PT evaluation with pt agreeable to tx. Pt reports prior to admission he was ambulatory with RW & without AD at [facility], but did require PRN assistance for bed mobility. On this date, pt requires hospital bed features & mod assist for supine>sit, bed>drop arm recliner via lateral scoot with mod assist. Pt with poor ability to clear buttocks & decreased initiation requiring max multimodal cuing throughout session. Pt is unsafe to attempt standing with +1 assist. Recommend ongoing PT services to maximize independence with mobility to reduce fall risk & decrease caregiver burden.      If plan is discharge home, recommend the following: Two people to help with walking and/or transfers;Two people to help with bathing/dressing/bathroom;Assist for transportation   Can travel by private vehicle   No    Equipment Recommendations Other (comment) (defer to next venue)  Recommendations for Other Services       Functional Status Assessment Patient has had a recent decline in their functional status and demonstrates the ability to make significant improvements in function in a reasonable and predictable amount of time.     Precautions / Restrictions Precautions Precautions: Fall Restrictions Weight Bearing Restrictions: No      Mobility  Bed Mobility Overal bed mobility: Needs Assistance Bed Mobility: Supine to Sit     Supine to sit: Mod assist, HOB elevated, Used rails     General bed mobility comments: Pt is able to upright trunk but requires significant assistance to scoot buttocks to EOB. Has to hold to bed rail/PT's hand to pull  to move LLE towards EOB as pt uses counter balance.    Transfers Overall transfer level: Needs assistance Equipment used: None Transfers: Bed to chair/wheelchair/BSC            Lateral/Scoot Transfers: Mod assist, From elevated surface General transfer comment: Pt transfers elevated bed>lower drop arm recliner on L with mod assist, cuing for hand placement, cuing for sequencing & technique, extra time overall. Once sitting in recliner, pt requires assistance from 2nd person to shift anteriorly & clear buttocks to allow PT to readjust pad underneath his buttocks.    Ambulation/Gait                  Stairs            Wheelchair Mobility     Tilt Bed    Modified Rankin (Stroke Patients Only)       Balance Overall balance assessment: Needs assistance Sitting-balance support: Feet supported, Bilateral upper extremity supported Sitting balance-Leahy Scale: Fair Sitting balance - Comments: supervision static sitting                                     Pertinent Vitals/Pain Pain Assessment Pain Assessment: Faces Faces Pain Scale: Hurts worst Pain Location: LLE to touch, with movement Pain Descriptors / Indicators: Discomfort, Guarding, Grimacing, Crying Pain Intervention(s): Monitored during session, Repositioned, Patient requesting pain meds-RN notified, RN gave pain meds during session    Home Living Family/patient expects to be discharged to:: Assisted living  Home Equipment: Agricultural consultant (2 wheels) Additional Comments: [facility]    Prior Function               Mobility Comments: Pt reports he was ambulatory with & without RW, needed PRN assistance to complete bed mobility.       Extremity/Trunk Assessment   Upper Extremity Assessment Upper Extremity Assessment: Defer to OT evaluation;LUE deficits/detail LUE Deficits / Details: Pt with LUE weakness but not formally assessed, reports he's able to lay  hand on RW vs grip it.    Lower Extremity Assessment Lower Extremity Assessment: LLE deficits/detail LLE Deficits / Details: LLE with blisters shin, wound on dorsal aspect of foot, painfult to touch & with movement       Communication   Communication Communication: Difficulty communicating thoughts/reduced clarity of speech (pt with intermittent stutter, requiring extra time)  Cognition Arousal: Alert Behavior During Therapy: WFL for tasks assessed/performed Overall Cognitive Status: Within Functional Limits for tasks assessed                                 General Comments: Pt requires multimodal cuing & extra time to follow simple commands throughout session, pt with decreased initiation requiring ongoing cuing to attempt tasks.        General Comments      Exercises     Assessment/Plan    PT Assessment Patient needs continued PT services  PT Problem List Pain;Decreased strength;Cardiopulmonary status limiting activity;Decreased safety awareness;Decreased activity tolerance;Decreased mobility;Decreased balance;Decreased skin integrity;Decreased knowledge of use of DME;Obesity       PT Treatment Interventions DME instruction;Balance training;Gait training;Neuromuscular re-education;Therapeutic activities;Therapeutic exercise;Manual techniques;Wheelchair mobility training;Functional mobility training;Patient/family education;Stair training    PT Goals (Current goals can be found in the Care Plan section)  Acute Rehab PT Goals Patient Stated Goal: decreased pain PT Goal Formulation: With patient Time For Goal Achievement: 01/11/23 Potential to Achieve Goals: Good    Frequency Min 1X/week     Co-evaluation               AM-PAC PT "6 Clicks" Mobility  Outcome Measure Help needed turning from your back to your side while in a flat bed without using bedrails?: A Lot Help needed moving from lying on your back to sitting on the side of a flat bed  without using bedrails?: A Lot Help needed moving to and from a bed to a chair (including a wheelchair)?: A Lot Help needed standing up from a chair using your arms (e.g., wheelchair or bedside chair)?: Total Help needed to walk in hospital room?: Total Help needed climbing 3-5 steps with a railing? : Total 6 Click Score: 9    End of Session   Activity Tolerance: Patient tolerated treatment well;Patient limited by pain Patient left: in chair;with chair alarm set;with call bell/phone within reach Nurse Communication: Mobility status PT Visit Diagnosis: Pain;Unsteadiness on feet (R26.81);Muscle weakness (generalized) (M62.81);Other abnormalities of gait and mobility (R26.89);Difficulty in walking, not elsewhere classified (R26.2) Pain - Right/Left: Left Pain - part of body: Ankle and joints of foot;Leg    Time: 1610-9604 PT Time Calculation (min) (ACUTE ONLY): 30 min   Charges:   PT Evaluation $PT Eval Moderate Complexity: 1 Mod   PT General Charges $$ ACUTE PT VISIT: 1 Visit         Aleda Grana, PT, DPT 12/28/22, 10:43 AM   Sandi Mariscal 12/28/2022, 10:42 AM

## 2022-12-28 NOTE — Consult Note (Signed)
Pharmacy Antibiotic Note  Eric Forbes is a 67 y.o. male admitted on 12/27/2022 with cellulitis.  Pharmacy has been consulted for vancomycin dosing.  New AKI 8/17, Scr 0.88 >> 1.36  Plan:  Discontinue scheduled vancomycin order. Will dose per levels given un-stable Scr. Will follow-up Scr tomorrow AM and consider level versus re-starting maintenance regimen  Height: 5\' 10"  (177.8 cm) Weight: 115.7 kg (255 lb 1.2 oz) IBW/kg (Calculated) : 73  Temp (24hrs), Avg:97.7 F (36.5 C), Min:97.5 F (36.4 C), Max:98.2 F (36.8 C)  Recent Labs  Lab 12/27/22 0933 12/28/22 0413 12/28/22 0440  WBC 10.0 10.6*  --   CREATININE 0.88  --  1.36*  LATICACIDVEN 1.2  --   --     Estimated Creatinine Clearance: 66.3 mL/min (A) (by C-G formula based on SCr of 1.36 mg/dL (H)).    Allergies  Allergen Reactions   Bee Venom Anaphylaxis    Antimicrobials this admission: Vancomycin 8/16 >>  Ceftriaxone 8/16 >>  Dose adjustments this admission: N/A  Microbiology results: 8/16 BCx: NGTD  Thank you for allowing pharmacy to be a part of this patient's care.  Tressie Ellis 12/28/2022 8:57 AM

## 2022-12-28 NOTE — Progress Notes (Signed)
[facility] Progress Note  Patient: Eric Forbes    GYI:[id number]  DOA: 12/27/2022     Date of Service: the patient was seen and examined on 12/28/2022  Chief Complaint  Patient presents with   Leg infection   Brief hospital course: Lei Wickizer is a 67 y.o. male with PMH of CAD, HTN, A. Fib on Eliquis, s/p Left Atrial Appendage Closure Device Implantation done in May 2024, CVA, type 2 diabetes, who presents to the ED due to leg infection.  Patient stated started 5 to 6 days ago, pain and redness was increasing gradually.  Patient had fever yesterday so patient was sent to the ED for further workup and management.   ED course: On arrival to the ED, patient was normotensive at 139/89 with heart rate of 83.  He was saturating at 98% on room air.  He was afebrile at 97.5. Initial workup notable for WBC of 10.0, hemoglobin 13.6, glucose of 183, creatinine 0.88 with GFR above 60.  Lactic acid 1.2.  Lower extremity Doppler negative for DVT.  Patient started on Zofran, morphine and Rocephin.  TRH contacted for admission.   Assessment and Plan:  # Lower extremity cellulitis Patient is presenting with extensive left lower extremity cellulitis with overlying blisters, one large blister on the foot that has opened  Given extensive nature, will admit for IV antibiotics.  No evidence of sepsis at this time. - Continue Rocephin and vancomycin Pharmacy consulted for dosing and trough monitoring Follow wound care consult Blood culture NGTD  # Lower extremity edema Started Lasix 40 mg IV twice daily Monitor urine output and renal functions Monitor electrolytes    # Essential hypertension 8/17 blood pressure soft Discontinue amlodipine, decreased lisinopril from 40 to 20 mg Continue sotalol Monitor BP and titrate medications accordingly   # Type 2 diabetes mellitus (HCC) - A1c pending - SSI, moderate   # A-fib  S/p left atrial appendage closure device insertion done in May 2024 Do  not know why this procedure was done and patient is still on Eliquis - Continue home Eliquis for now, recommend to follow-up with cardiologist and PCP  # Depression, continue home medications  # BPH, continue Flomax   Body mass index is 36.6 kg/m.  Interventions:  Diet: Heart healthy/carb modified DVT Prophylaxis: Therapeutic Anticoagulation with Eliquis    Advance goals of care discussion: Full code  Family Communication: family was not present at bedside, at the time of interview.  The pt provided permission to discuss medical plan with the family. Opportunity was given to ask question and all questions were answered satisfactorily.   Disposition:  Pt is from SNF, admitted with LLE cellulitis, still on IV antibiotics which precludes a safe discharge. Discharge to SNF, when clinically stable, may need 2-3 days of IV antibiotics..  Subjective: No significant events overnight, patient having pain and tenderness in the left lower extremity, feels a little bit improvement.  Denied any other complaint.  Physical Exam: General: NAD, lying comfortably Appear in no distress, affect appropriate Eyes: PERRLA ENT: Oral Mucosa Clear, moist  Neck: no JVD,  Cardiovascular: S1 and S2 Present, no Murmur,  Respiratory: good respiratory effort, Bilateral Air entry equal and Decreased, no Crackles, no wheezes Abdomen: Bowel Sound present, Soft and no tenderness,  Skin: no rashes Extremities: no Pedal edema, no calf tenderness Neurologic: without any new focal findings Gait not checked due to patient safety concerns  Vitals:   12/27/22 1917 12/28/22 0502 12/28/22 0504 12/28/22 0835  BP:  134/72 (!) 92/53  131/85  Pulse: (!) 104 78 80 (!) 102  Resp: 20 16  15   Temp: 97.8 F (36.6 C) 98.2 F (36.8 C)  97.7 F (36.5 C)  TempSrc: Oral Oral  Axillary  SpO2: 95% (!) 89% 100% 99%  Weight:      Height:        Intake/Output Summary (Last 24 hours) at 12/28/2022 1428 Last data filed at  12/28/2022 1211 Gross per 24 hour  Intake 562.69 ml  Output 1100 ml  Net -537.31 ml   Filed Weights   12/27/22 0931  Weight: 115.7 kg    Data Reviewed: I have personally reviewed and interpreted daily labs, tele strips, imagings as discussed above. I reviewed all nursing notes, pharmacy notes, vitals, pertinent old records I have discussed plan of care as described above with RN and patient/family.  CBC: Recent Labs  Lab 12/27/22 0933 12/28/22 0413  WBC 10.0 10.6*  HGB 13.6 13.2  HCT 38.4* 37.3*  MCV 70.2* 69.3*  PLT 168 186   Basic Metabolic Panel: Recent Labs  Lab 12/27/22 0933 12/28/22 0406 12/28/22 0440  NA 136  --  135  K 4.1  --  5.1  CL 100  --  100  CO2 28  --  28  GLUCOSE 183*  --  153*  BUN 17  --  19  CREATININE 0.88  --  1.36*  CALCIUM 9.2  --  9.0  MG  --  2.0  --   PHOS  --  5.5*  --     Studies: No results found.  Scheduled Meds:  acetaminophen  500 mg Oral TID   apixaban  5 mg Oral BID   aspirin EC  81 mg Oral Daily   busPIRone  10 mg Oral BID   clopidogrel  75 mg Oral Daily   DULoxetine  90 mg Oral Daily   furosemide  40 mg Intravenous BID   insulin aspart  0-20 Units Subcutaneous TID WC   insulin glargine-yfgn  35 Units Subcutaneous QHS   [START ON 12/29/2022] lisinopril  20 mg Oral Daily   melatonin  5 mg Oral QHS   polyvinyl alcohol  2 drop Both Eyes BID   pregabalin  50 mg Oral TID   rosuvastatin  10 mg Oral QHS   sodium chloride flush  3 mL Intravenous Q12H   sotalol  80 mg Oral Q12H   tamsulosin  0.4 mg Oral QPC supper   traZODone  25 mg Oral QHS   vancomycin variable dose per unstable renal function (pharmacist dosing)   Does not apply See admin instructions   Continuous Infusions:  sodium chloride 10 mL/hr at 12/28/22 1211   cefTRIAXone (ROCEPHIN)  IV Stopped (12/28/22 0953)   PRN Meds: sodium chloride, acetaminophen **OR** acetaminophen, albuterol, cyclobenzaprine, HYDROmorphone (DILAUDID) injection, hydrOXYzine,  ondansetron **OR** ondansetron (ZOFRAN) IV, oxyCODONE, polyethylene glycol  Time spent: 35 minutes  Author: [name]. MD [facility] 12/28/2022 2:28 PM  To reach On-call, see care teams to locate the attending and reach out to them via [email or web address]. If 7PM-7AM, please contact night-coverage If you still have difficulty reaching the attending provider, please page the [facility] (Director on Call) for [facility] on amion for assistance.

## 2022-12-28 NOTE — Consult Note (Signed)
PHARMACY CONSULT NOTE - ELECTROLYTES  Pharmacy Consult for Electrolyte Monitoring and Replacement   Recent Labs: Potassium (mmol/L)  Date Value  12/27/2022 4.1   Magnesium (mg/dL)  Date Value  60/45/4098 2.0   Calcium (mg/dL)  Date Value  11/91/4782 9.2   Albumin (g/dL)  Date Value  95/62/1308 3.7   Phosphorus (mg/dL)  Date Value  65/78/4696 3.5   Sodium (mmol/L)  Date Value  12/27/2022 136   Height: 5\' 10"  (177.8 cm) Weight: 115.7 kg (255 lb 1.2 oz) IBW/kg (Calculated) : 73 Estimated Creatinine Clearance: 102.4 mL/min (by C-G formula based on SCr of 0.88 mg/dL).  Assessment  Eric Forbes is a 67 y.o. male presenting with lower extremity cellulitis. PMH significant for Afib on apixaban, CVA, DM, HTN, CAD. Pharmacy has been consulted to monitor and replace electrolytes.  Diet: Heart healthy / carb modified MIVF: N/A Pertinent medications: Sotalol, IV Lasix  Goal of Therapy: Electrolytes within normal limits  Plan:  Patient with apparent AKI. No electrolyte replacement indicated at this time. Potassium trending up to ULN Follow-up electrolytes with AM labs tomorrow  Thank you for allowing pharmacy to be a part of this patient's care.  Tressie Ellis 12/28/2022 8:12 AM

## 2022-12-29 DIAGNOSIS — L03116 Cellulitis of left lower limb: Secondary | ICD-10-CM | POA: Diagnosis not present

## 2022-12-29 LAB — BASIC METABOLIC PANEL
Anion gap: 7 (ref 5–15)
BUN: 25 mg/dL — ABNORMAL HIGH (ref 8–23)
CO2: 29 mmol/L (ref 22–32)
Calcium: 9.1 mg/dL (ref 8.9–10.3)
Chloride: 100 mmol/L (ref 98–111)
Creatinine, Ser: 0.97 mg/dL (ref 0.61–1.24)
GFR, Estimated: >60 mL/min (ref >60)
Glucose, Bld: 253 mg/dL — ABNORMAL HIGH (ref 70–99)
Potassium: 4.5 mmol/L (ref 3.5–5.1)
Sodium: 136 mmol/L (ref 135–145)

## 2022-12-29 LAB — CBC
HCT: 36.5 % — ABNORMAL LOW (ref 39.0–52.0)
Hemoglobin: 12.6 g/dL — ABNORMAL LOW (ref 13.0–17.0)
MCH: 24.2 pg — ABNORMAL LOW (ref 26.0–34.0)
MCHC: 34.5 g/dL (ref 30.0–36.0)
MCV: 70.1 fL — ABNORMAL LOW (ref 80.0–100.0)
Platelets: 145 10*3/uL — ABNORMAL LOW (ref 150–400)
RBC: 5.21 MIL/uL (ref 4.22–5.81)
RDW: 16.1 % — ABNORMAL HIGH (ref 11.5–15.5)
WBC: 8.9 10*3/uL (ref 4.0–10.5)
nRBC: 0.3 % — ABNORMAL HIGH (ref 0.0–0.2)

## 2022-12-29 LAB — MAGNESIUM: Magnesium: 2.1 mg/dL (ref 1.7–2.4)

## 2022-12-29 LAB — GLUCOSE, CAPILLARY
Glucose-Capillary: 179 mg/dL — ABNORMAL HIGH (ref 70–99)
Glucose-Capillary: 190 mg/dL — ABNORMAL HIGH (ref 70–99)
Glucose-Capillary: 265 mg/dL — ABNORMAL HIGH (ref 70–99)
Glucose-Capillary: 207 mg/dL — ABNORMAL HIGH (ref 70–99)

## 2022-12-29 LAB — PHOSPHORUS: Phosphorus: 4.1 mg/dL (ref 2.5–4.6)

## 2022-12-29 MED ORDER — VITAMIN D (ERGOCALCIFEROL) 1.25 MG (50000 UNIT) PO CAPS
50000.0000 [IU] | ORAL_CAPSULE | ORAL | Status: DC
  Administered 2022-12-29 – 2023-01-12 (×3): 50000 [IU] via ORAL
  Filled 2022-12-29 (×4): qty 1

## 2022-12-29 MED ORDER — VANCOMYCIN HCL IN DEXTROSE 1-5 GM/200ML-% IV SOLN
1000.0000 mg | Freq: Two times a day (BID) | INTRAVENOUS | Status: DC
  Administered 2022-12-29 – 2022-12-30 (×4): 1000 mg via INTRAVENOUS
  Filled 2022-12-29 (×5): qty 200

## 2022-12-29 MED ORDER — SODIUM CHLORIDE 0.9 % IV SOLN
2.0000 g | INTRAVENOUS | Status: DC
  Administered 2022-12-29 – 2023-01-02 (×5): 2 g via INTRAVENOUS
  Filled 2022-12-29 (×5): qty 20

## 2022-12-29 NOTE — Consult Note (Signed)
Pharmacy Antibiotic Note  Eric Forbes is a 67 y.o. male admitted on 12/27/2022 with cellulitis.  Pharmacy has been consulted for vancomycin dosing.  New AKI 8/17, Scr 0.88 >> 1.36 > 0.92  Plan: Scr back to baseline. Will restart vancomycin 1000 mg q12H. Predicted AUC of 517. Goal AUC 400-600. Scr 0.97, IBW, Vd 0.5. Plan to obtain level after 4th or 5th dose.   Height: 5\' 10"  (177.8 cm) Weight: 115.7 kg (255 lb 1.2 oz) IBW/kg (Calculated) : 73  Temp (24hrs), Avg:98.4 F (36.9 C), Min:98.1 F (36.7 C), Max:98.6 F (37 C)  Recent Labs  Lab 12/27/22 0933 12/28/22 0413 12/28/22 0440 12/29/22 0452  WBC 10.0 10.6*  --  8.9  CREATININE 0.88  --  1.36* 0.97  LATICACIDVEN 1.2  --   --   --     Estimated Creatinine Clearance: 92.9 mL/min (by C-G formula based on SCr of 0.97 mg/dL).    Allergies  Allergen Reactions   Bee Venom Anaphylaxis    Antimicrobials this admission: Vancomycin 8/16 >>  Ceftriaxone 8/16 >>  Dose adjustments this admission: N/A  Microbiology results: 8/16 BCx: NGTD  Thank you for allowing pharmacy to be a part of this patient's care.  Ronnald Ramp, PharmD, BCPS 12/29/2022 8:39 AM

## 2022-12-29 NOTE — Consult Note (Signed)
WOC Nurse Consult Note: Reason for Consult:bilateral LE wounds consisting of ruptured and intact, serum filled blisters and full thickness wound on anterior left foot. Consult performed remotely following review of medical records including photographs of lesions. Wound type:Infectious Pressure Injury POA:N/A Measurement:To be obtained by bedside RN and documented on nursing flow sheet with performance of next dressing change today Wound bed:red, dry Drainage (amount, consistency, odor) scant serous Periwound: edematous Dressing procedure/placement/frequency: Pharmacy has been consulted for dosing of systemic antibiotics. I will provide conservative topical care guidance for Nursing using a daily NS cleanse followed by covering the lesions with antimicrobial nonadherent gauze (xeroform), topping with ABD pads for comfort and protection and securement with a few turns of Kerlix roll gauze/paper tape. Feet are to be placed into Prevalon Boots. A sacral foam is to be placed for PI prevention.  WOC nursing team will not follow, but will remain available to this patient, the nursing and medical teams.  Please re-consult if needed.  Thank you for inviting [place] to participate in this patient's Plan of Care.  Ladona Mow, MSN, RN, CNS, GNP, Leda Min, [employer], [employer] phone:  [phone]

## 2022-12-29 NOTE — Consult Note (Signed)
PHARMACY CONSULT NOTE - ELECTROLYTES  Pharmacy Consult for Electrolyte Monitoring and Replacement   Recent Labs: Potassium (mmol/L)  Date Value  12/29/2022 4.5   Magnesium (mg/dL)  Date Value  40/98/1191 2.1   Calcium (mg/dL)  Date Value  47/82/9562 9.1   Albumin (g/dL)  Date Value  13/12/6576 3.7   Phosphorus (mg/dL)  Date Value  46/96/2952 4.1   Sodium (mmol/L)  Date Value  12/29/2022 136   Height: 5\' 10"  (177.8 cm) Weight: 115.7 kg (255 lb 1.2 oz) IBW/kg (Calculated) : 73 Estimated Creatinine Clearance: 92.9 mL/min (by C-G formula based on SCr of 0.97 mg/dL).  Assessment  Eric Forbes is a 67 y.o. male presenting with lower extremity cellulitis. PMH significant for Afib on apixaban, CVA, DM, HTN, CAD. Pharmacy has been consulted to monitor and replace electrolytes.  Diet: Heart healthy / carb modified MIVF: N/A Pertinent medications: Sotalol, IV Lasix  Goal of Therapy: K+ > 4 and Mg > 2  Plan:  No replacement needed at this time F/u with AM labs.   Thank you for allowing pharmacy to be a part of this patient's care.  Ronnald Ramp, PharmD, BCPS 12/29/2022 8:38 AM

## 2022-12-29 NOTE — Progress Notes (Signed)
[facility] Progress Note  Patient: Eric Forbes    GNF:[id number]  DOA: 12/27/2022     Date of Service: the patient was seen and examined on 12/29/2022  Chief Complaint  Patient presents with   Leg infection   Brief hospital course: Eric Forbes is a 67 y.o. male with PMH of CAD, HTN, A. Fib on Eliquis, s/p Left Atrial Appendage Closure Device Implantation done in May 2024, CVA, type 2 diabetes, who presents to the ED due to leg infection.  Patient stated started 5 to 6 days ago, pain and redness was increasing gradually.  Patient had fever yesterday so patient was sent to the ED for further workup and management.   ED course: On arrival to the ED, patient was normotensive at 139/89 with heart rate of 83.  He was saturating at 98% on room air.  He was afebrile at 97.5. Initial workup notable for WBC of 10.0, hemoglobin 13.6, glucose of 183, creatinine 0.88 with GFR above 60.  Lactic acid 1.2.  Lower extremity Doppler negative for DVT.  Patient started on Zofran, morphine and Rocephin.  TR[facility] contacted for admission.   Assessment and Plan:  # Lower extremity cellulitis Patient is presenting with extensive left lower extremity cellulitis with overlying blisters, one large blister on the foot that has opened  Given extensive nature, will admit for IV antibiotics.  No evidence of sepsis at this time. - Continue Rocephin and vancomycin Pharmacy consulted for dosing and trough monitoring Follow wound care consult Blood culture NGTD  # Lower extremity edema Started Lasix 40 mg IV twice daily Monitor urine output and renal functions Monitor electrolytes    # Essential hypertension 8/17 blood pressure soft Discontinue amlodipine, decreased lisinopril from 40 to 20 mg Continue sotalol Monitor BP and titrate medications accordingly   # Type 2 diabetes mellitus (HCC) - A1c pending - SSI, moderate   # A-fib  S/p left atrial appendage closure device insertion done in May 2024 Do  not know why this procedure was done and patient is still on Eliquis - Continue home Eliquis for now, recommend to follow-up with cardiologist and PCP for DOAC  # Depression, continue home medications  # BPH, continue Flomax  # Generalized weakness Transferrin saturation 55%, vitamin B12 378 and vitamin D 38 Started oral iron supplement, B12 supplement and vitamin D supplement. Repeat labs after 3 to 6 months.  Body mass index is 36.6 kg/m.  Interventions:  Diet: Heart healthy/carb modified DVT Prophylaxis: Therapeutic Anticoagulation with Eliquis    Advance goals of care discussion: Full code  Family Communication: family was not present at bedside, at the time of interview.  The pt provided permission to discuss medical plan with the family. Opportunity was given to ask question and all questions were answered satisfactorily.   Disposition:  Pt is from SNF, admitted with LLE cellulitis, still on IV antibiotics which precludes a safe discharge. Discharge to SNF, when clinically stable, may need 2-3 days of IV antibiotics..  Subjective: No significant events overnight, patient still has significant pain in the left lower extremity 9/10, he was about to postabortion for the RN to get pain medication.  Denied any chest pain or palpitation no shortness of breath, no any other active issues.  Physical Exam: General: NAD, lying comfortably Appear in no distress, affect appropriate Eyes: PERRLA ENT: Oral Mucosa Clear, moist  Neck: no JVD,  Cardiovascular: S1 and S2 Present, no Murmur,  Respiratory: good respiratory effort, Bilateral Air entry equal and Decreased,  no Crackles, no wheezes Abdomen: Bowel Sound present, Soft and no tenderness,  Skin: no rashes Extremities: LLE erythema and tenderness, 1 large and 1 small bulla unruptured, 1 ruptured bullous on dorsum of left foot.  2+ edema  edema, no calf tenderness. RLE normal Neurologic: without any new focal findings Gait not  checked due to patient safety concerns  Vitals:   12/28/22 1735 12/28/22 2101 12/29/22 0509 12/29/22 0816  BP: 127/84 (!) 130/97 133/86 122/74  Pulse: 85 80 83 91  Resp: 16 20 20 20   Temp: 98.3 F (36.8 C) 98.1 F (36.7 C) 98.4 F (36.9 C) 98.6 F (37 C)  TempSrc: Oral Oral Oral   SpO2: 95% 94% 99% 94%  Weight:      Height:        Intake/Output Summary (Last 24 hours) at 12/29/2022 1542 Last data filed at 12/29/2022 1059 Gross per 24 hour  Intake 720 ml  Output 2350 ml  Net -1630 ml   Filed Weights   12/27/22 0931  Weight: 115.7 kg    Data Reviewed: I have personally reviewed and interpreted daily labs, tele strips, imagings as discussed above. I reviewed all nursing notes, pharmacy notes, vitals, pertinent old records I have discussed plan of care as described above with RN and patient/family.  CBC: Recent Labs  Lab 12/27/22 0933 12/28/22 0413 12/29/22 0452  WBC 10.0 10.6* 8.9  HGB 13.6 13.2 12.6*  HCT 38.4* 37.3* 36.5*  MCV 70.2* 69.3* 70.1*  PLT 168 186 145*   Basic Metabolic Panel: Recent Labs  Lab 12/27/22 0933 12/28/22 0406 12/28/22 0440 12/29/22 0452  NA 136  --  135 136  K 4.1  --  5.1 4.5  CL 100  --  100 100  CO2 28  --  28 29  GLUCOSE 183*  --  153* 253*  BUN 17  --  19 25*  CREATININE 0.88  --  1.36* 0.97  CALCIUM 9.2  --  9.0 9.1  MG  --  2.0  --  2.1  PHOS  --  5.5*  --  4.1    Studies: No results found.  Scheduled Meds:  acetaminophen  500 mg Oral TID   apixaban  5 mg Oral BID   vitamin C  500 mg Oral Daily   aspirin EC  81 mg Oral Daily   busPIRone  10 mg Oral BID   clopidogrel  75 mg Oral Daily   vitamin B-12  500 mcg Oral Daily   DULoxetine  90 mg Oral Daily   furosemide  40 mg Intravenous BID   insulin aspart  0-20 Units Subcutaneous TID WC   insulin glargine-yfgn  35 Units Subcutaneous QHS   iron polysaccharides  150 mg Oral Daily   lisinopril  20 mg Oral Daily   melatonin  5 mg Oral QHS   polyvinyl alcohol  2 drop  Both Eyes BID   pregabalin  50 mg Oral TID   rosuvastatin  10 mg Oral QHS   sodium chloride flush  3 mL Intravenous Q12H   sotalol  80 mg Oral Q12H   tamsulosin  0.4 mg Oral QPC supper   traZODone  25 mg Oral QHS   vancomycin variable dose per unstable renal function (pharmacist dosing)   Does not apply See admin instructions   Vitamin D (Ergocalciferol)  50,000 Units Oral Q7 days   Continuous Infusions:  sodium chloride 10 mL/hr at 12/28/22 1211   cefTRIAXone (ROCEPHIN)  IV 2 g (  12/29/22 1027)   vancomycin 1,000 mg (12/29/22 1254)   PRN Meds: sodium chloride, acetaminophen **OR** acetaminophen, albuterol, cyclobenzaprine, HYDROmorphone (DILAUDID) injection, hydrOXYzine, ondansetron **OR** ondansetron (ZOFRAN) IV, oxyCODONE, polyethylene glycol  Time spent: 35 minutes  Author: [name]. MD [facility] 12/29/2022 3:42 PM  To reach On-call, see care teams to locate the attending and reach out to them via [email or web address]. If 7PM-7AM, please contact night-coverage If you still have difficulty reaching the attending provider, please page the [facility] (Director on Call) for [facility] on amion for assistance.

## 2022-12-30 DIAGNOSIS — L03116 Cellulitis of left lower limb: Secondary | ICD-10-CM | POA: Diagnosis not present

## 2022-12-30 LAB — GLUCOSE, CAPILLARY
Glucose-Capillary: 233 mg/dL — ABNORMAL HIGH (ref 70–99)
Glucose-Capillary: 302 mg/dL — ABNORMAL HIGH (ref 70–99)
Glucose-Capillary: 184 mg/dL — ABNORMAL HIGH (ref 70–99)
Glucose-Capillary: 191 mg/dL — ABNORMAL HIGH (ref 70–99)

## 2022-12-30 LAB — CBC
HCT: 34.5 % — ABNORMAL LOW (ref 39.0–52.0)
Hemoglobin: 12.4 g/dL — ABNORMAL LOW (ref 13.0–17.0)
MCH: 24.7 pg — ABNORMAL LOW (ref 26.0–34.0)
MCHC: 35.9 g/dL (ref 30.0–36.0)
MCV: 68.6 fL — ABNORMAL LOW (ref 80.0–100.0)
Platelets: 143 10*3/uL — ABNORMAL LOW (ref 150–400)
RBC: 5.03 MIL/uL (ref 4.22–5.81)
RDW: 16 % — ABNORMAL HIGH (ref 11.5–15.5)
WBC: 8.2 10*3/uL (ref 4.0–10.5)
nRBC: 0 % (ref 0.0–0.2)

## 2022-12-30 LAB — BASIC METABOLIC PANEL
Anion gap: 5 (ref 5–15)
BUN: 22 mg/dL (ref 8–23)
CO2: 33 mmol/L — ABNORMAL HIGH (ref 22–32)
Calcium: 9.2 mg/dL (ref 8.9–10.3)
Chloride: 99 mmol/L (ref 98–111)
Creatinine, Ser: 0.97 mg/dL (ref 0.61–1.24)
GFR, Estimated: >60 mL/min (ref >60)
Glucose, Bld: 234 mg/dL — ABNORMAL HIGH (ref 70–99)
Potassium: 4.2 mmol/L (ref 3.5–5.1)
Sodium: 137 mmol/L (ref 135–145)

## 2022-12-30 LAB — MAGNESIUM: Magnesium: 2 mg/dL (ref 1.7–2.4)

## 2022-12-30 LAB — PHOSPHORUS: Phosphorus: 3.6 mg/dL (ref 2.5–4.6)

## 2022-12-30 LAB — HEMOGLOBIN A1C
Hgb A1c MFr Bld: 7.2 % — ABNORMAL HIGH (ref 4.8–5.6)
Mean Plasma Glucose: 160 mg/dL

## 2022-12-30 MED ORDER — INSULIN ASPART 100 UNIT/ML IJ SOLN
0.0000 [IU] | Freq: Every day | INTRAMUSCULAR | Status: DC
  Administered 2022-12-31: 5 [IU] via SUBCUTANEOUS
  Administered 2023-01-04: 3 [IU] via SUBCUTANEOUS
  Administered 2023-01-06: 5 [IU] via SUBCUTANEOUS
  Administered 2023-01-07: 3 [IU] via SUBCUTANEOUS
  Administered 2023-01-09: 5 [IU] via SUBCUTANEOUS
  Administered 2023-01-11 – 2023-01-13 (×2): 2 [IU] via SUBCUTANEOUS
  Administered 2023-01-14: 4 [IU] via SUBCUTANEOUS
  Administered 2023-01-15: 3 [IU] via SUBCUTANEOUS
  Filled 2022-12-30 (×13): qty 1

## 2022-12-30 MED ORDER — QUETIAPINE FUMARATE 25 MG PO TABS: 25.0000 mg | ORAL_TABLET | Freq: Two times a day (BID) | ORAL | Status: DC

## 2022-12-30 MED ORDER — INSULIN ASPART 100 UNIT/ML IJ SOLN
5.0000 [IU] | Freq: Three times a day (TID) | INTRAMUSCULAR | Status: DC
  Administered 2022-12-30 – 2023-01-01 (×6): 5 [IU] via SUBCUTANEOUS
  Filled 2022-12-30 (×5): qty 1

## 2022-12-30 MED ORDER — INSULIN ASPART 100 UNIT/ML IJ SOLN
0.0000 [IU] | Freq: Three times a day (TID) | INTRAMUSCULAR | Status: DC
  Administered 2022-12-30: 15 [IU] via SUBCUTANEOUS

## 2022-12-30 MED ORDER — QUETIAPINE FUMARATE 25 MG PO TABS
12.5000 mg | ORAL_TABLET | Freq: Once | ORAL | Status: AC
  Administered 2022-12-30: 12.5 mg via ORAL
  Filled 2022-12-30: qty 1

## 2022-12-30 MED ORDER — INSULIN ASPART 100 UNIT/ML IJ SOLN: 0.0000 [IU] | Freq: Every day | INTRAMUSCULAR | Status: DC

## 2022-12-30 MED ORDER — INSULIN ASPART 100 UNIT/ML IJ SOLN
0.0000 [IU] | Freq: Three times a day (TID) | INTRAMUSCULAR | Status: DC
  Administered 2022-12-30: 3 [IU] via SUBCUTANEOUS
  Administered 2022-12-31: 8 [IU] via SUBCUTANEOUS
  Administered 2022-12-31: 3 [IU] via SUBCUTANEOUS
  Administered 2022-12-31: 8 [IU] via SUBCUTANEOUS
  Administered 2023-01-01 (×2): 3 [IU] via SUBCUTANEOUS
  Administered 2023-01-01: 8 [IU] via SUBCUTANEOUS
  Administered 2023-01-02: 5 [IU] via SUBCUTANEOUS
  Administered 2023-01-02: 2 [IU] via SUBCUTANEOUS
  Administered 2023-01-02: 3 [IU] via SUBCUTANEOUS
  Administered 2023-01-03: 5 [IU] via SUBCUTANEOUS
  Administered 2023-01-03 – 2023-01-04 (×3): 3 [IU] via SUBCUTANEOUS
  Administered 2023-01-04: 5 [IU] via SUBCUTANEOUS
  Administered 2023-01-04 – 2023-01-05 (×2): 3 [IU] via SUBCUTANEOUS
  Administered 2023-01-05 (×2): 2 [IU] via SUBCUTANEOUS
  Administered 2023-01-06: 8 [IU] via SUBCUTANEOUS
  Administered 2023-01-06 – 2023-01-07 (×2): 3 [IU] via SUBCUTANEOUS
  Administered 2023-01-07: 8 [IU] via SUBCUTANEOUS
  Administered 2023-01-08 (×2): 3 [IU] via SUBCUTANEOUS
  Administered 2023-01-08: 2 [IU] via SUBCUTANEOUS
  Administered 2023-01-09 (×2): 3 [IU] via SUBCUTANEOUS
  Administered 2023-01-09: 5 [IU] via SUBCUTANEOUS
  Administered 2023-01-10: 2 [IU] via SUBCUTANEOUS
  Administered 2023-01-10: 5 [IU] via SUBCUTANEOUS
  Administered 2023-01-10 – 2023-01-12 (×5): 3 [IU] via SUBCUTANEOUS
  Administered 2023-01-12: 2 [IU] via SUBCUTANEOUS
  Administered 2023-01-12 – 2023-01-13 (×3): 3 [IU] via SUBCUTANEOUS
  Administered 2023-01-13: 8 [IU] via SUBCUTANEOUS
  Administered 2023-01-14 (×2): 3 [IU] via SUBCUTANEOUS
  Administered 2023-01-15 (×2): 2 [IU] via SUBCUTANEOUS
  Administered 2023-01-15: 3 [IU] via SUBCUTANEOUS
  Administered 2023-01-16: 8 [IU] via SUBCUTANEOUS
  Filled 2022-12-30 (×47): qty 1

## 2022-12-30 MED ORDER — INSULIN GLARGINE-YFGN 100 UNIT/ML ~~LOC~~ SOLN
45.0000 [IU] | Freq: Every day | SUBCUTANEOUS | Status: DC
  Administered 2022-12-30 – 2023-01-09 (×11): 45 [IU] via SUBCUTANEOUS
  Filled 2022-12-30 (×11): qty 0.45

## 2022-12-30 MED ORDER — ENOXAPARIN SODIUM 60 MG/0.6ML IJ SOSY
55.0000 mg | PREFILLED_SYRINGE | INTRAMUSCULAR | Status: DC
  Administered 2022-12-30 – 2023-01-15 (×17): 55 mg via SUBCUTANEOUS
  Filled 2022-12-30 (×17): qty 0.6

## 2022-12-30 NOTE — Progress Notes (Signed)
[facility] Progress Note  Patient: Eric Forbes    JXB:[id number]  DOA: 12/27/2022     Date of Service: the patient was seen and examined on 12/30/2022  Chief Complaint  Patient presents with   Leg infection   Brief hospital course: Eric Forbes is a 67 y.o. male with PMH of CAD, HTN, A. Fib on Eliquis, s/p Left Atrial Appendage Closure Device Implantation done in May 2024, CVA, type 2 diabetes, who presents to the ED due to leg infection.  Patient stated started 5 to 6 days ago, pain and redness was increasing gradually.  Patient had fever yesterday so patient was sent to the ED for further workup and management.   ED course: On arrival to the ED, patient was normotensive at 139/89 with heart rate of 83.  He was saturating at 98% on room air.  He was afebrile at 97.5. Initial workup notable for WBC of 10.0, hemoglobin 13.6, glucose of 183, creatinine 0.88 with GFR above 60.  Lactic acid 1.2.  Lower extremity Doppler negative for DVT.  Patient started on Zofran, morphine and Rocephin.  [facility] contacted for admission.   Assessment and Plan:  # Lower extremity cellulitis Patient is presenting with extensive left lower extremity cellulitis with overlying blisters, one large blister on the foot that has opened  Given extensive nature, will admit for IV antibiotics.  No evidence of sepsis at this time. - Continue Rocephin and vancomycin Pharmacy consulted for dosing and trough monitoring Follow wound care consult Blood culture NGTD  # Lower extremity edema Started Lasix 40 mg IV twice daily Monitor urine output and renal functions Monitor electrolytes    # Essential hypertension 8/17 blood pressure soft Discontinue amlodipine, decreased lisinopril from 40 to 20 mg Continue sotalol Monitor BP and titrate medications accordingly   # Type 2 diabetes mellitus, HbA1c 7.2, well-controlled 8/19 Increased Semglee 45 units nightly, started NovoLog 5 units 3 times daily and continue  NovoLog sliding scale.  Monitor CBG, continue diabetic diet.    # A-fib  S/p left atrial appendage closure device insertion done in May 2024 But patient was still on Eliquis I do not know why.  I discontinued Eliquis for now, patient will remain at high risk for bleeding being on triple regimen, patient is on DAPT as well Discontinue Eliquis and follow-up with cardiologist as an outpatient for further recommendation of DOAC  # Depression, continue home medications  # BPH, continue Flomax  # Generalized weakness Transferrin saturation 55%, vitamin B12 378 and vitamin D 38 Started oral iron supplement, B12 supplement and vitamin D supplement. Repeat labs after 3 to 6 months.  Body mass index is 36.6 kg/m.  Interventions:  Diet: Heart healthy/carb modified DVT Prophylaxis: Therapeutic Anticoagulation with Eliquis    Advance goals of care discussion: Full code  Family Communication: family was not present at bedside, at the time of interview.  The pt provided permission to discuss medical plan with the family. Opportunity was given to ask question and all questions were answered satisfactorily.  8/19 discussed with patient's brother over the phone  Disposition:  Pt is from SNF, admitted with LLE cellulitis, still on IV antibiotics which precludes a safe discharge. Discharge to SNF, when clinically stable, may need 2-3 days of IV antibiotics..  Subjective: No significant events overnight, patient was complaining of pain in the left lower extremity 9/10 but he said it is not constant this is off and on.  Overall he is improving, denies any complaints.  Physical  Exam: General: NAD, lying comfortably Appear in no distress, affect appropriate Eyes: PERRLA ENT: Oral Mucosa Clear, moist  Neck: no JVD,  Cardiovascular: S1 and S2 Present, no Murmur,  Respiratory: good respiratory effort, Bilateral Air entry equal and Decreased, no Crackles, no wheezes Abdomen: Bowel Sound present, Soft  and no tenderness,  Skin: no rashes Extremities: LLE erythema and tenderness, 1 large and 1 small bulla unruptured, 1 ruptured bullous on dorsum of left foot.  2+ edema  edema, no calf tenderness. RLE normal Neurologic: without any new focal findings Gait not checked due to patient safety concerns  Vitals:   12/29/22 1555 12/29/22 2102 12/30/22 0441 12/30/22 0745  BP: 136/81 129/68 132/85 (!) 148/77  Pulse: 84 88 79 77  Resp: 18 20 20 16   Temp: 98.5 F (36.9 C) (!) 97.3 F (36.3 C) 97.8 F (36.6 C) 98 F (36.7 C)  TempSrc:  Oral Oral Oral  SpO2: 96% 100% 100% 100%  Weight:      Height:        Intake/Output Summary (Last 24 hours) at 12/30/2022 1525 Last data filed at 12/30/2022 1154 Gross per 24 hour  Intake 1344.93 ml  Output 2550 ml  Net -1205.07 ml   Filed Weights   12/27/22 0931  Weight: 115.7 kg    Data Reviewed: I have personally reviewed and interpreted daily labs, tele strips, imagings as discussed above. I reviewed all nursing notes, pharmacy notes, vitals, pertinent old records I have discussed plan of care as described above with RN and patient/family.  CBC: Recent Labs  Lab 12/27/22 0933 12/28/22 0413 12/29/22 0452 12/30/22 0528  WBC 10.0 10.6* 8.9 8.2  HGB 13.6 13.2 12.6* 12.4*  HCT 38.4* 37.3* 36.5* 34.5*  MCV 70.2* 69.3* 70.1* 68.6*  PLT 168 186 145* 143*   Basic Metabolic Panel: Recent Labs  Lab 12/27/22 0933 12/28/22 0406 12/28/22 0440 12/29/22 0452 12/30/22 0528  NA 136  --  135 136 137  K 4.1  --  5.1 4.5 4.2  CL 100  --  100 100 99  CO2 28  --  28 29 33*  GLUCOSE 183*  --  153* 253* 234*  BUN 17  --  19 25* 22  CREATININE 0.88  --  1.36* 0.97 0.97  CALCIUM 9.2  --  9.0 9.1 9.2  MG  --  2.0  --  2.1 2.0  PHOS  --  5.5*  --  4.1 3.6    Studies: No results found.  Scheduled Meds:  acetaminophen  500 mg Oral TID   vitamin C  500 mg Oral Daily   aspirin EC  81 mg Oral Daily   busPIRone  10 mg Oral BID   clopidogrel  75 mg  Oral Daily   vitamin B-12  500 mcg Oral Daily   DULoxetine  90 mg Oral Daily   enoxaparin (LOVENOX) injection  55 mg Subcutaneous Q24H   furosemide  40 mg Intravenous BID   insulin aspart  0-15 Units Subcutaneous TID WC   insulin aspart  0-5 Units Subcutaneous QHS   insulin aspart  5 Units Subcutaneous TID WC   insulin glargine-yfgn  45 Units Subcutaneous QHS   iron polysaccharides  150 mg Oral Daily   lisinopril  20 mg Oral Daily   melatonin  5 mg Oral QHS   polyvinyl alcohol  2 drop Both Eyes BID   pregabalin  50 mg Oral TID   rosuvastatin  10 mg Oral QHS   sodium  chloride flush  3 mL Intravenous Q12H   sotalol  80 mg Oral Q12H   tamsulosin  0.4 mg Oral QPC supper   traZODone  25 mg Oral QHS   Vitamin D (Ergocalciferol)  50,000 Units Oral Q7 days   Continuous Infusions:  sodium chloride 10 mL/hr at 12/28/22 1211   cefTRIAXone (ROCEPHIN)  IV 2 g (12/30/22 0835)   vancomycin 1,000 mg (12/30/22 1012)   PRN Meds: sodium chloride, acetaminophen **OR** acetaminophen, albuterol, cyclobenzaprine, HYDROmorphone (DILAUDID) injection, hydrOXYzine, ondansetron **OR** ondansetron (ZOFRAN) IV, oxyCODONE, polyethylene glycol  Time spent: 35 minutes  Author: [name]. MD [facility] 12/30/2022 3:25 PM  To reach On-call, see care teams to locate the attending and reach out to them via [email or web address]. If 7PM-7AM, please contact night-coverage If you still have difficulty reaching the attending provider, please page the [facility] (Director on Call) for [facility] on amion for assistance.

## 2022-12-30 NOTE — Consult Note (Signed)
PHARMACY CONSULT NOTE - ELECTROLYTES  Pharmacy Consult for Electrolyte Monitoring and Replacement   Recent Labs: Potassium (mmol/L)  Date Value  12/30/2022 4.2   Magnesium (mg/dL)  Date Value  74/25/9563 2.0   Calcium (mg/dL)  Date Value  87/56/4332 9.2   Albumin (g/dL)  Date Value  95/18/8416 3.7   Phosphorus (mg/dL)  Date Value  60/63/0160 3.6   Sodium (mmol/L)  Date Value  12/30/2022 137   Height: 5\' 10"  (177.8 cm) Weight: 115.7 kg (255 lb 1.2 oz) IBW/kg (Calculated) : 73 Estimated Creatinine Clearance: 92.9 mL/min (by C-G formula based on SCr of 0.97 mg/dL).  Assessment  Eric Forbes is a 67 y.o. male presenting with lower extremity cellulitis. PMH significant for Afib on apixaban, CVA, DM, HTN, CAD. Pharmacy has been consulted to monitor and replace electrolytes.  Diet: Heart healthy / carb modified MIVF: N/A Pertinent medications: Sotalol, IV Lasix  Goal of Therapy: K+ > 4 and Mg > 2  Plan:  No replacement needed at this time F/u with AM labs.   Thank you for allowing pharmacy to be a part of this patient's care.  Ronnald Ramp, PharmD, BCPS 12/30/2022 8:24 AM

## 2022-12-30 NOTE — Progress Notes (Signed)
Physical Therapy Treatment Patient Details Name: Eric Forbes MRN: [id number] DOB: 03-15-1956 Today's Date: 12/30/2022   History of Present Illness Pt is a 67 y/o M admitted on 12/27/22 after presenting with c/o pain & redness in LLE. Pt is being treated for LLE cellulitis. PMH: a-fib on Eliquis, CVA, DM2, HTN, CAD, MI    PT Comments  Pt sitting in recliner upon PT arrival and requesting assistance with his clothing (pt had no shirt on and shorts were pulled up to his thighs); therapist assisted pt with donning clothing.   Pt unable to stand from recliner up to RW with 1 assist so performed squat pivot transfer recliner to bed with mod to max assist x1.  Pt then stood x2 trials from bed (significantly elevated surface) with min assist and UE support on bed rail (pt unable to stand from bed at lowest height).  Pt requiring vc's for safety during session.  Pt refused to lay down in bed or get back into chair (pt requesting to stay sitting on edge of bed; pt also requesting w/c to go outside)--nurse notified and bed alarm placed on.   If plan is discharge home, recommend the following: Two people to help with walking and/or transfers;Two people to help with bathing/dressing/bathroom;Assist for transportation   Can travel by private vehicle     No  Equipment Recommendations  Other (comment) (TBD at next venue of care)    Recommendations for Other Services       Precautions / Restrictions Precautions Precautions: Fall Restrictions Weight Bearing Restrictions: No     Mobility  Bed Mobility                    Transfers Overall transfer level: Needs assistance   Transfers: Sit to/from Stand, Bed to chair/wheelchair/BSC Sit to Stand: Min assist, From elevated surface     Squat pivot transfers: Mod assist, Max assist     General transfer comment: pt unable to stand from recliner up to RW with 1 assist; performed squat pivot transfer recliner to bed with mod to max assist x1;  pt stood x2 trials from bed (significantly elevated surface) with min assist--pt unable to stand from bed at lowest height; vc's for technique and safety    Ambulation/Gait               General Gait Details: Deferred d/t safety concerns   Stairs             Wheelchair Mobility     Tilt Bed    Modified Rankin (Stroke Patients Only)       Balance Overall balance assessment: Needs assistance Sitting-balance support: No upper extremity supported, Feet supported Sitting balance-Leahy Scale: Good Sitting balance - Comments: steady reaching within BOS   Standing balance support: Single extremity supported Standing balance-Leahy Scale: Fair Standing balance comment: steady static standing with at least single UE support                            Cognition Arousal: Alert Behavior During Therapy: Impulsive Overall Cognitive Status: Within Functional Limits for tasks assessed                                 General Comments: Vc's for safety and to redirect pt during session        Exercises      General Comments  Nursing  cleared pt for participation in physical therapy.  Pt agreeable to PT session.      Pertinent Vitals/Pain Pain Assessment Pain Assessment: Faces Faces Pain Scale: Hurts little more Pain Location: L LE Pain Descriptors / Indicators: Discomfort, Guarding Pain Intervention(s): Limited activity within patient's tolerance, Monitored during session, Repositioned Vitals (HR and SpO2 on room air) stable and WFL throughout treatment session.    Home Living                          Prior Function            PT Goals (current goals can now be found in the care plan section) Acute Rehab PT Goals Patient Stated Goal: decrease pain; improve mobility PT Goal Formulation: With patient Time For Goal Achievement: 01/11/23 Potential to Achieve Goals: Good Progress towards PT goals: Progressing toward goals     Frequency    Min 1X/week      PT Plan      Co-evaluation              AM-PAC PT "6 Clicks" Mobility   Outcome Measure  Help needed turning from your back to your side while in a flat bed without using bedrails?: A Lot Help needed moving from lying on your back to sitting on the side of a flat bed without using bedrails?: A Lot Help needed moving to and from a bed to a chair (including a wheelchair)?: A Lot Help needed standing up from a chair using your arms (e.g., wheelchair or bedside chair)?: Total Help needed to walk in hospital room?: Total Help needed climbing 3-5 steps with a railing? : Total 6 Click Score: 9    End of Session Equipment Utilized During Treatment: Gait belt Activity Tolerance: Patient tolerated treatment well Patient left: with bed alarm set;with call bell/phone within reach (sitting on EOB with call light in reach; bed alarm on) Nurse Communication: Mobility status;Precautions;Other (comment) (pt sitting on EOB with bed alarm on) PT Visit Diagnosis: Pain;Unsteadiness on feet (R26.81);Muscle weakness (generalized) (M62.81);Other abnormalities of gait and mobility (R26.89);Difficulty in walking, not elsewhere classified (R26.2) Pain - Right/Left: Left Pain - part of body: Ankle and joints of foot;Leg     Time: 1610-9604 PT Time Calculation (min) (ACUTE ONLY): 23 min  Charges:    $Therapeutic Activity: 23-37 mins PT General Charges $$ ACUTE PT VISIT: 1 Visit                     Hendricks Limes, PT 12/30/22, 3:43 PM

## 2022-12-30 NOTE — Progress Notes (Signed)
Mobility Specialist - Progress Note     12/30/22 1134  Mobility  Activity Ambulated with assistance in room;Stood at bedside  Level of Assistance Minimal assist, patient does 75% or more  Assistive Device Front wheel walker  Distance Ambulated (ft) 8 ft  Range of Motion/Exercises Active  Activity Response Tolerated well  Mobility Referral Yes  $Mobility charge 1 Mobility  Mobility Specialist Start Time (ACUTE ONLY) 1105  Mobility Specialist Stop Time (ACUTE ONLY) 1130  Mobility Specialist Time Calculation (min) (ACUTE ONLY) 25 min   Pt resting EOB upon entry on RA. Pt agreeable to ambulation. Pt given VC for sequencing and foot blocking due to weakness on left side. Pt STS Min/ModA and ambulates forward 4 steps with right arm on RW. Limited ROM in LUE (inability to grab walker with left hand). Pt needed extra time moving LLE. Pt ambulates backward toward recliner and left with needs in reach. Chair alarm activated.   Johnathan Hausen Mobility Specialist 12/30/22, 11:49 AM

## 2022-12-30 NOTE — NC FL2 (Signed)
[place] MEDICAID FL2 LEVEL OF CARE FORM     IDENTIFICATION  Patient Name: Eric Forbes Birthdate: 06-21-1955 Sex: male Admission Date (Current Location): 12/27/2022  [facility] and [name] Number:  Chiropodist and Address:  [facility], [address], [place], [place] [address]      Provider Number: [id number]  Attending Physician Name and Address:  [name], MD  Relative Name and Phone Number:  Forbes,Eric (Sister)  [phone] [facility])    Current Level of Care: Hospital Recommended Level of Care: Skilled Nursing Facility Prior Approval Number:    Date Approved/Denied:   PASRR Number:   [id number]  Discharge Plan:  SNF    Current Diagnoses: Patient Active Problem List   Diagnosis Date Noted   Cellulitis of left lower extremity 12/28/2022   Type 2 diabetes mellitus (HCC) 12/27/2022   Lower extremity cellulitis 12/27/2022   Essential hypertension 12/27/2022   PVD (peripheral vascular disease) (HCC) 10/08/2022   Carotid stenosis 03/19/2022   Chest pain 09/19/2019   CAD (coronary artery disease) 09/19/2019   CVA (cerebral vascular accident) (HCC) 09/19/2019   A-fib (HCC) 09/19/2019   Microcytic anemia 09/19/2019    Orientation RESPIRATION BLADDER Height & Weight     Self, Place, Situation, Time  Normal External catheter, Incontinent Weight: 115.7 kg Height:  5\' 10"  (177.8 cm)  BEHAVIORAL SYMPTOMS/MOOD NEUROLOGICAL BOWEL NUTRITION STATUS      Continent Diet ([facility])  AMBULATORY STATUS COMMUNICATION OF NEEDS Skin   Limited Assist Verbally Other (Comment) (Blister to left foot and leg)                       Personal Care Assistance Level of Assistance  Bathing, Dressing Bathing Assistance: Limited assistance   Dressing Assistance: Limited assistance     Functional Limitations Info             SPECIAL CARE FACTORS FREQUENCY  PT (By licensed PT), OT (By licensed OT)     PT Frequency: Min 2x weekly OT  Frequency: Min 2x weekly            Contractures      Additional Factors Info  Code Status, Allergies Code Status Info: FULL Allergies Info: Bee Venom           Current Medications (12/30/2022):  This is the current hospital active medication list Current Facility-Administered Medications  Medication Dose Route Frequency Provider Last Rate Last Admin   0.9 %  sodium chloride infusion   Intravenous PRN [name], MD 10 mL/hr at 12/28/22 1211 Infusion Verify at 12/28/22 1211   acetaminophen (TYLENOL) tablet 650 mg  650 mg Oral Q6H PRN Verdene Lennert, MD   650 mg at 12/29/22 0730   Or   acetaminophen (TYLENOL) suppository 650 mg  650 mg Rectal Q6H PRN Verdene Lennert, MD       acetaminophen (TYLENOL) tablet 500 mg  500 mg Oral TID Verdene Lennert, MD   500 mg at 12/30/22 0825   albuterol (PROVENTIL) (2.5 MG/3ML) 0.083% nebulizer solution 2.5 mg  2.5 mg Inhalation Q6H PRN Verdene Lennert, MD       ascorbic acid (VITAMIN C) tablet 500 mg  500 mg Oral Daily [name], MD   500 mg at 12/30/22 4401   aspirin EC tablet 81 mg  81 mg Oral Daily Verdene Lennert, MD   81 mg at 12/30/22 0824   busPIRone (BUSPAR) tablet 10 mg  10 mg Oral BID Verdene Lennert, MD  10 mg at 12/30/22 0824   cefTRIAXone (ROCEPHIN) 2 g in sodium chloride 0.9 % 100 mL IVPB  2 g Intravenous Q24H [name], MD 200 mL/hr at 12/30/22 0835 2 g at 12/30/22 0835   clopidogrel (PLAVIX) tablet 75 mg  75 mg Oral Daily Verdene Lennert, MD   75 mg at 12/30/22 9528   cyanocobalamin (VITAMIN B12) tablet 500 mcg  500 mcg Oral Daily [name], MD   500 mcg at 12/30/22 4132   cyclobenzaprine (FLEXERIL) tablet 10 mg  10 mg Oral TID PRN Verdene Lennert, MD   10 mg at 12/29/22 1504   DULoxetine (CYMBALTA) DR capsule 90 mg  90 mg Oral Daily Verdene Lennert, MD   90 mg at 12/30/22 0824   enoxaparin (LOVENOX) injection 55 mg  55 mg Subcutaneous Q24H [name], MD       furosemide (LASIX) injection 40 mg  40 mg  Intravenous BID [name], MD   40 mg at 12/30/22 4401   HYDROmorphone (DILAUDID) injection 0.5-1 mg  0.5-1 mg Intravenous Q2H PRN Verdene Lennert, MD   1 mg at 12/30/22 1223   hydrOXYzine (ATARAX) tablet 25 mg  25 mg Oral Q8H PRN Verdene Lennert, MD   25 mg at 12/30/22 0824   insulin aspart (novoLOG) injection 0-15 Units  0-15 Units Subcutaneous TID WC [name], MD       insulin aspart (novoLOG) injection 0-5 Units  0-5 Units Subcutaneous QHS [name], MD       insulin aspart (novoLOG) injection 5 Units  5 Units Subcutaneous TID WC [name], MD   5 Units at 12/30/22 1303   insulin glargine-yfgn (SEMGLEE) injection 45 Units  45 Units Subcutaneous QHS [name], MD       iron polysaccharides (NIFEREX) capsule 150 mg  150 mg Oral Daily [name], MD   150 mg at 12/30/22 0829   lisinopril (ZESTRIL) tablet 20 mg  20 mg Oral Daily [name], MD   20 mg at 12/30/22 0272   melatonin tablet 5 mg  5 mg Oral QHS Verdene Lennert, MD   5 mg at 12/29/22 2222   ondansetron (ZOFRAN) tablet 4 mg  4 mg Oral Q6H PRN Verdene Lennert, MD       Or   ondansetron (ZOFRAN) injection 4 mg  4 mg Intravenous Q6H PRN Verdene Lennert, MD       oxyCODONE (Oxy IR/ROXICODONE) immediate release tablet 5 mg  5 mg Oral Q6H PRN Verdene Lennert, MD   5 mg at 12/30/22 0850   polyethylene glycol (MIRALAX / GLYCOLAX) packet 17 g  17 g Oral Daily PRN Verdene Lennert, MD       polyvinyl alcohol (LIQUIFILM TEARS) 1.4 % ophthalmic solution 2 drop  2 drop Both Eyes BID Verdene Lennert, MD   2 drop at 12/30/22 1027   pregabalin (LYRICA) capsule 50 mg  50 mg Oral TID Verdene Lennert, MD   50 mg at 12/30/22 0824   rosuvastatin (CRESTOR) tablet 10 mg  10 mg Oral QHS Verdene Lennert, MD   10 mg at 12/29/22 2222   sodium chloride flush (NS) 0.9 % injection 3 mL  3 mL Intravenous Q12H Verdene Lennert, MD   3 mL at 12/30/22 1303   sotalol (BETAPACE) tablet 80 mg  80 mg Oral Q12H [name], MD   80 mg at 12/30/22  0829   tamsulosin (FLOMAX) capsule 0.4 mg  0.4 mg Oral QPC supper Verdene Lennert, MD   0.4 mg  at 12/29/22 1743   traZODone (DESYREL) tablet 25 mg  25 mg Oral QHS Verdene Lennert, MD   25 mg at 12/29/22 2222   vancomycin (VANCOCIN) IVPB 1000 mg/200 mL premix  1,000 mg Intravenous Q12H Ronnald Ramp, RPH 200 mL/hr at 12/30/22 1012 1,000 mg at 12/30/22 1012   Vitamin D (Ergocalciferol) (DRISDOL) 1.25 MG (50000 UNIT) capsule 50,000 Units  50,000 Units Oral Q7 days [name], MD   50,000 Units at 12/29/22 1743     Discharge Medications: Please see discharge summary for a list of discharge medications.  Relevant Imaging Results:  Relevant Lab Results:   Additional Information SS# [id number]  Truddie Hidden, RN

## 2022-12-30 NOTE — Inpatient Diabetes Management (Signed)
Inpatient Diabetes Program Recommendations  AACE/ADA: New Consensus Statement on Inpatient Glycemic Control (2015)  Target Ranges:  Prepandial:   less than 140 mg/dL      Peak postprandial:   less than 180 mg/dL (1-2 hours)      Critically ill patients:  140 - 180 mg/dL   Lab Results  Component Value Date   GLUCAP 302 (H) 12/30/2022   HGBA1C 7.2 (H) 12/27/2022    Latest Reference Range & Units 12/29/22 08:15 12/29/22 12:44 12/29/22 16:51 12/29/22 20:59 12/30/22 07:49 12/30/22 11:51  Glucose-Capillary 70 - 99 mg/dL 161 (H) 096 (H) 045 (H) 207 (H) 233 (H) 302 (H)  (H): Data is abnormally high  Review of Glycemic Control  Diabetes history: DM2 Outpatient Diabetes medications: Semglee 70 units q hs. Novolog 28 units tid meal coverage Current orders for Inpatient glycemic control: Semglee 45 units at bedtime, Novolog 0-20 units tid  Inpatient Diabetes Program Recommendations:   Please consider: -Add Novolog 5 units tid meal coverage if eats 50% meals -Decrease Novolog correction to 0-15 units tid, 0-5 units hs  Thank you, Darel Hong E. Derrisha Foos, RN, MSN, CDE  Diabetes Coordinator Inpatient Glycemic Control Team Team Pager [phone] (8am-5pm) 12/30/2022 12:03 PM

## 2022-12-30 NOTE — TOC Initial Note (Signed)
Transition of Care [facility]) - Initial/Assessment Note    Patient Details  Name: Eric Forbes MRN: [id number] Date of Birth: 1956-01-01  [facility]) CM/SW Contact:    Truddie Hidden, RN Phone Number: 12/30/2022, 2:08 PM  Clinical Narrative:                 Spoke with patient regarding discharge plan and therapy recommendation for SNF. E is agreeable to go to SNF. He does not have a preference at this time but would like to speak with his family first. FL2 completed.         Patient Goals and CMS Choice            Expected Discharge Plan and Services                                              Prior Living Arrangements/Services                       Activities of Daily Living Home Assistive Devices/Equipment: Dan Humphreys (specify type) ADL Screening (condition at time of admission) Patient's cognitive ability adequate to safely complete daily activities?: Yes Is the patient deaf or have difficulty hearing?: Yes Does the patient have difficulty seeing, even when wearing glasses/contacts?: No Does the patient have difficulty concentrating, remembering, or making decisions?: No Patient able to express need for assistance with ADLs?: Yes Does the patient have difficulty dressing or bathing?: No Independently performs ADLs?: Yes (appropriate for developmental age) Does the patient have difficulty walking or climbing stairs?: No Weakness of Legs: Left Weakness of Arms/Hands: Left  Permission Sought/Granted                  Emotional Assessment              Admission diagnosis:  Cellulitis of left lower extremity [L03.116] Lower extremity cellulitis [L03.119] Patient Active Problem List   Diagnosis Date Noted   Cellulitis of left lower extremity 12/28/2022   Type 2 diabetes mellitus (HCC) 12/27/2022   Lower extremity cellulitis 12/27/2022   Essential hypertension 12/27/2022   PVD (peripheral vascular disease) (HCC) 10/08/2022    Carotid stenosis 03/19/2022   Chest pain 09/19/2019   CAD (coronary artery disease) 09/19/2019   CVA (cerebral vascular accident) (HCC) 09/19/2019   A-fib (HCC) 09/19/2019   Microcytic anemia 09/19/2019   PCP:  [facility]:   [facility] - [place] [place] - [address] [place] [address] Phone: [phone] Fax: [phone]  [place] [facility] [address] [place] [address] Phone: [phone] Fax: [phone]     Social Determinants of Health (SDOH) Social History: SDOH Screenings   Food Insecurity: No Food Insecurity (12/27/2022)  Housing: Low Risk  (12/27/2022)  Transportation Needs: No Transportation Needs (12/27/2022)  Utilities: Not At Risk (12/27/2022)  Tobacco Use: High Risk (12/27/2022)   SDOH Interventions:     Readmission Risk Interventions     No data to display

## 2022-12-31 ENCOUNTER — Other Ambulatory Visit (HOSPITAL_COMMUNITY): Payer: Self-pay

## 2022-12-31 DIAGNOSIS — L03116 Cellulitis of left lower limb: Secondary | ICD-10-CM | POA: Diagnosis not present

## 2022-12-31 LAB — CBC
HCT: 35.7 % — ABNORMAL LOW (ref 39.0–52.0)
Hemoglobin: 12.9 g/dL — ABNORMAL LOW (ref 13.0–17.0)
MCH: 24.7 pg — ABNORMAL LOW (ref 26.0–34.0)
MCHC: 36.1 g/dL — ABNORMAL HIGH (ref 30.0–36.0)
MCV: 68.4 fL — ABNORMAL LOW (ref 80.0–100.0)
Platelets: 158 10*3/uL (ref 150–400)
RBC: 5.22 MIL/uL (ref 4.22–5.81)
RDW: 15.9 % — ABNORMAL HIGH (ref 11.5–15.5)
WBC: 9.4 10*3/uL (ref 4.0–10.5)
nRBC: 0 % (ref 0.0–0.2)

## 2022-12-31 LAB — BASIC METABOLIC PANEL
Anion gap: 5 (ref 5–15)
BUN: 22 mg/dL (ref 8–23)
CO2: 30 mmol/L (ref 22–32)
Calcium: 9 mg/dL (ref 8.9–10.3)
Chloride: 99 mmol/L (ref 98–111)
Creatinine, Ser: 1.11 mg/dL (ref 0.61–1.24)
GFR, Estimated: >60 mL/min (ref >60)
Glucose, Bld: 156 mg/dL — ABNORMAL HIGH (ref 70–99)
Potassium: 5 mmol/L (ref 3.5–5.1)
Sodium: 134 mmol/L — ABNORMAL LOW (ref 135–145)

## 2022-12-31 LAB — GLUCOSE, CAPILLARY
Glucose-Capillary: 163 mg/dL — ABNORMAL HIGH (ref 70–99)
Glucose-Capillary: 287 mg/dL — ABNORMAL HIGH (ref 70–99)
Glucose-Capillary: 260 mg/dL — ABNORMAL HIGH (ref 70–99)
Glucose-Capillary: 404 mg/dL — ABNORMAL HIGH (ref 70–99)

## 2022-12-31 LAB — PHOSPHORUS: Phosphorus: 4.9 mg/dL — ABNORMAL HIGH (ref 2.5–4.6)

## 2022-12-31 LAB — MAGNESIUM: Magnesium: 2.2 mg/dL (ref 1.7–2.4)

## 2022-12-31 MED ORDER — ASCORBIC ACID 500 MG PO TABS
500.0000 mg | ORAL_TABLET | Freq: Every day | ORAL | 0 refills | Status: AC
  Filled 2022-12-31: qty 90, 90d supply, fill #0

## 2022-12-31 MED ORDER — AMLODIPINE BESYLATE 5 MG PO TABS: 5.0000 mg | ORAL_TABLET | Freq: Every day | ORAL | Status: AC

## 2022-12-31 MED ORDER — LISINOPRIL 20 MG PO TABS: 20.0000 mg | ORAL_TABLET | Freq: Every day | ORAL | Status: AC

## 2022-12-31 MED ORDER — DOXYCYCLINE HYCLATE 100 MG PO TABS: 100.0000 mg | ORAL_TABLET | Freq: Two times a day (BID) | ORAL | Status: DC

## 2022-12-31 MED ORDER — POLYSACCHARIDE IRON COMPLEX 150 MG PO CAPS: 150.0000 mg | ORAL_CAPSULE | Freq: Every day | ORAL | Status: AC

## 2022-12-31 MED ORDER — POLYETHYLENE GLYCOL 3350 17 G PO PACK: 17.0000 g | PACK | Freq: Every day | ORAL | Status: AC | PRN

## 2022-12-31 MED ORDER — DOXYCYCLINE HYCLATE 100 MG PO TABS
100.0000 mg | ORAL_TABLET | Freq: Two times a day (BID) | ORAL | Status: DC
  Administered 2022-12-31 – 2023-01-02 (×6): 100 mg via ORAL
  Filled 2022-12-31 (×6): qty 1

## 2022-12-31 MED ORDER — FUROSEMIDE 40 MG PO TABS: 40.0000 mg | ORAL_TABLET | Freq: Every day | ORAL | Status: AC

## 2022-12-31 MED ORDER — VANCOMYCIN HCL 1750 MG/350ML IV SOLN: 1750.0000 mg | INTRAVENOUS | Status: DC

## 2022-12-31 MED ORDER — VANCOMYCIN HCL 1750 MG/350ML IV SOLN
1750.0000 mg | INTRAVENOUS | Status: DC
  Filled 2022-12-31: qty 350

## 2022-12-31 MED ORDER — HYDROCODONE-ACETAMINOPHEN 5-325 MG PO TABS: 1.0000 | ORAL_TABLET | Freq: Four times a day (QID) | ORAL | 0 refills | Status: AC | PRN

## 2022-12-31 MED ORDER — CYANOCOBALAMIN 500 MCG PO TABS: 500.0000 ug | ORAL_TABLET | Freq: Every day | ORAL | Status: AC

## 2022-12-31 NOTE — Consult Note (Addendum)
PHARMACY CONSULT NOTE - ELECTROLYTES  Pharmacy Consult for Electrolyte Monitoring and Replacement   Recent Labs: Potassium (mmol/L)  Date Value  12/31/2022 5.0   Magnesium (mg/dL)  Date Value  62/13/0865 2.2   Calcium (mg/dL)  Date Value  78/46/9629 9.0   Albumin (g/dL)  Date Value  52/84/1324 3.7   Phosphorus (mg/dL)  Date Value  40/02/2724 4.9 (H)   Sodium (mmol/L)  Date Value  12/31/2022 134 (L)   Height: 5\' 10"  (177.8 cm) Weight: 115.7 kg (255 lb 1.2 oz) IBW/kg (Calculated) : 73 Estimated Creatinine Clearance: 81.2 mL/min (by C-G formula based on SCr of 1.11 mg/dL).  Assessment  Eric Forbes is a 67 y.o. male presenting with lower extremity cellulitis. PMH significant for Afib on apixaban, CVA, DM, HTN, CAD. Pharmacy has been consulted to monitor and replace electrolytes.  Diet: heart healthy / carb modified MIVF: N/A Pertinent medications: Sotalol, IV Lasix  Goal of Therapy: K+ > 4 and Mg > 2  Plan:  No replacement needed at this time F/u with AM labs.   Thank you for allowing pharmacy to be a part of this patient's care.  [name], PharmD PGY1 Pharmacy Resident 12/31/2022 8:01 AM

## 2022-12-31 NOTE — Progress Notes (Signed)
Physical Therapy Treatment Patient Details Name: Eric Forbes MRN: [id number] DOB: 04/13/1956 Today's Date: 12/31/2022   History of Present Illness Pt is a 67 y/o M admitted on 12/27/22 after presenting with c/o pain & redness in LLE. Pt is being treated for LLE cellulitis. PMH: a-fib on Eliquis, CVA, DM2, HTN, CAD, MI    PT Comments  Patient endorses pain meds have "worn off" and he feels like he will better tolerate activity today.  Endorses L LE pain at 8.5/10 (meds administered per RN during session), but agreeable to participation with session.  Able to initiate and progress short-distance gait activities (5' x1, 10' x1, 15' x1) with RW, min assist +2 (second person chair follow for safety).   Demonstrates 3-point, step to gait pattern, leading wiht L LE; maintains broad BOS with excessive L LE hip abduct/ER throughout gait cycle, limited loading phase/stance time L LE.  Walker tends to veer R with distance, limited ability to correct due to baseline weakness in L UE/hand.  Does endorse soreness/pain in R knee, improved with placement of ace wrap for gentle compression. Continues to require +1 physical assist for all bed mobility, transfers and gait (with RW); discharge recommendation remains appropriate.  However, if [facility] able to provide necessary level of assist, may consider return to ALF with follow up [facility] therapy as needed (for progression in familiar environment, normal routine).  TOC informed/aware.   If plan is discharge home, recommend the following: Two people to help with walking and/or transfers;Two people to help with bathing/dressing/bathroom;Assist for transportation   Can travel by private vehicle     No  Equipment Recommendations       Recommendations for Other Services       Precautions / Restrictions Precautions Precautions: Fall Restrictions Weight Bearing Restrictions: No     Mobility  Bed Mobility Overal bed mobility: Needs Assistance Bed Mobility:  Supine to Sit     Supine to sit: Min assist, Mod assist     General bed mobility comments: transition towards R side of bed; assist for L UE/LE placement and initiation of weight shift towards edge of bed    Transfers Overall transfer level: Needs assistance Equipment used: Rolling walker (2 wheels) Transfers: Sit to/from Stand, Bed to chair/wheelchair/BSC Sit to Stand: Min assist Stand pivot transfers: Min assist         General transfer comment: heavy use of rocking/momentum to generate weight shift and initiate lift off from seating surface; heavy reliance on R hemi-body to complete transfer, often maintaining L LE anterior to BOS (for pain control).  Assist required to position L UE on RW once standing (but does maintain grasp once placed)    Ambulation/Gait Ambulation/Gait assistance: Min assist, +2 safety/equipment Gait Distance (Feet):  (5' x1, 10' x1, 20' x1)           General Gait Details: 3-point, step to gait pattern, leading wiht L LE; maintains broad BOS with excessive L LE hip abduct/ER throughout gait cycle, limited loading phase/stance time L LE.  Walker tends to veer R with distance, limited ability to correct due to baseline weakness in L UE/hand.  Does endorse soreness/pain in R knee, improved with placement of ace wrap for gentle compression.   Stairs             Wheelchair Mobility     Tilt Bed    Modified Rankin (Stroke Patients Only)       Balance Overall balance assessment: Needs assistance Sitting-balance support: No  upper extremity supported, Feet supported Sitting balance-Leahy Scale: Good     Standing balance support: Bilateral upper extremity supported Standing balance-Leahy Scale: Fair                              Cognition Arousal: Alert Behavior During Therapy: WFL for tasks assessed/performed Overall Cognitive Status: Within Functional Limits for tasks assessed                                           Exercises      General Comments        Pertinent Vitals/Pain Pain Assessment Pain Assessment: 0-10 Pain Score: 8  Pain Location: L LE Pain Descriptors / Indicators: Discomfort, Guarding Pain Intervention(s): Limited activity within patient's tolerance, Monitored during session, Repositioned, Patient requesting pain meds-RN notified, RN gave pain meds during session    Home Living                          Prior Function            PT Goals (current goals can now be found in the care plan section) Acute Rehab PT Goals Patient Stated Goal: decrease pain; improve mobility PT Goal Formulation: With patient Time For Goal Achievement: 01/11/23 Potential to Achieve Goals: Good    Frequency    Min 1X/week      PT Plan      Co-evaluation              AM-PAC PT "6 Clicks" Mobility   Outcome Measure  Help needed turning from your back to your side while in a flat bed without using bedrails?: A Lot Help needed moving from lying on your back to sitting on the side of a flat bed without using bedrails?: A Lot Help needed moving to and from a bed to a chair (including a wheelchair)?: A Little Help needed standing up from a chair using your arms (e.g., wheelchair or bedside chair)?: A Little Help needed to walk in hospital room?: A Little Help needed climbing 3-5 steps with a railing? : A Lot 6 Click Score: 15    End of Session Equipment Utilized During Treatment: Gait belt Activity Tolerance: Patient tolerated treatment well Patient left: in chair;with call bell/phone within reach;with chair alarm set Nurse Communication: Mobility status PT Visit Diagnosis: Pain;Unsteadiness on feet (R26.81);Muscle weakness (generalized) (M62.81);Other abnormalities of gait and mobility (R26.89);Difficulty in walking, not elsewhere classified (R26.2) Pain - Right/Left: Left Pain - part of body: Ankle and joints of foot;Leg     Time: 1420-1500 PT Time  Calculation (min) (ACUTE ONLY): 40 min  Charges:    $Gait Training: 8-22 mins $Therapeutic Activity: 23-37 mins PT General Charges $$ ACUTE PT VISIT: 1 Visit                     Eric Forbes H. Manson Passey, PT, DPT, NCS 12/31/22, 3:27 PM [phone]

## 2022-12-31 NOTE — Plan of Care (Signed)
  Problem: Education: Goal: Ability to describe self-care measures that may prevent or decrease complications (Diabetes Survival Skills Education) will improve 12/31/2022 0355 by Lamount Cohen, RN Outcome: Progressing 12/31/2022 0355 by Lamount Cohen, RN Outcome: Progressing Goal: Individualized Educational Video(s) 12/31/2022 0355 by Lamount Cohen, RN Outcome: Progressing 12/31/2022 0355 by Lamount Cohen, RN Outcome: Progressing   Problem: Coping: Goal: Ability to adjust to condition or change in health will improve 12/31/2022 0355 by Lamount Cohen, RN Outcome: Progressing 12/31/2022 0355 by Lamount Cohen, RN Outcome: Progressing   Problem: Fluid Volume: Goal: Ability to maintain a balanced intake and output will improve 12/31/2022 0355 by Lamount Cohen, RN Outcome: Progressing 12/31/2022 0355 by Lamount Cohen, RN Outcome: Progressing   Problem: Health Behavior/Discharge Planning: Goal: Ability to identify and utilize available resources and services will improve 12/31/2022 0355 by Lamount Cohen, RN Outcome: Progressing 12/31/2022 0355 by Lamount Cohen, RN Outcome: Progressing Goal: Ability to manage health-related needs will improve 12/31/2022 0355 by Lamount Cohen, RN Outcome: Progressing 12/31/2022 0355 by Lamount Cohen, RN Outcome: Progressing   Problem: Metabolic: Goal: Ability to maintain appropriate glucose levels will improve 12/31/2022 0355 by Lamount Cohen, RN Outcome: Progressing 12/31/2022 0355 by Lamount Cohen, RN Outcome: Progressing   Problem: Nutritional: Goal: Maintenance of adequate nutrition will improve 12/31/2022 0355 by Lamount Cohen, RN Outcome: Progressing 12/31/2022 0355 by Lamount Cohen, RN Outcome: Progressing Goal: Progress toward achieving an optimal weight will improve 12/31/2022 0355 by Lamount Cohen, RN Outcome: Progressing 12/31/2022 0355 by Lamount Cohen, RN Outcome:  Progressing   Problem: Skin Integrity: Goal: Risk for impaired skin integrity will decrease 12/31/2022 0355 by Lamount Cohen, RN Outcome: Progressing 12/31/2022 0355 by Lamount Cohen, RN Outcome: Progressing   Problem: Tissue Perfusion: Goal: Adequacy of tissue perfusion will improve 12/31/2022 0355 by Lamount Cohen, RN Outcome: Progressing 12/31/2022 0355 by Lamount Cohen, RN Outcome: Progressing   Problem: Education: Goal: Knowledge of General Education information will improve Description: Including pain rating scale, medication(s)/side effects and non-pharmacologic comfort measures 12/31/2022 0355 by Lamount Cohen, RN Outcome: Progressing 12/31/2022 0355 by Lamount Cohen, RN Outcome: Progressing   Problem: Health Behavior/Discharge Planning: Goal: Ability to manage health-related needs will improve Outcome: Progressing   Problem: Clinical Measurements: Goal: Ability to maintain clinical measurements within normal limits will improve Outcome: Progressing Goal: Will remain free from infection Outcome: Progressing Goal: Diagnostic test results will improve Outcome: Progressing Goal: Respiratory complications will improve Outcome: Progressing Goal: Cardiovascular complication will be avoided Outcome: Progressing   Problem: Activity: Goal: Risk for activity intolerance will decrease Outcome: Progressing   Problem: Nutrition: Goal: Adequate nutrition will be maintained Outcome: Progressing   Problem: Coping: Goal: Level of anxiety will decrease Outcome: Progressing   Problem: Elimination: Goal: Will not experience complications related to bowel motility Outcome: Progressing Goal: Will not experience complications related to urinary retention Outcome: Progressing   Problem: Pain Managment: Goal: General experience of comfort will improve Outcome: Progressing   Problem: Safety: Goal: Ability to remain free from injury will improve Outcome:  Progressing   Problem: Skin Integrity: Goal: Risk for impaired skin integrity will decrease Outcome: Progressing

## 2022-12-31 NOTE — TOC Progression Note (Addendum)
Transition of Care [facility]) - Progression Note    Patient Details  Name: Eric Forbes MRN: [id number] Date of Birth: [birth year]  [facility] CM/SW Contact  [name], RN Phone Number: 12/31/2022, 2:25 PM  Clinical Narrative:      Met with patient at bedside.  Sister [name] was on the phone Patient and sister confirm they would like to move forward with a bed search Patient states that he he would like to use his Medicare Benefits instead of the VA benefits for SNF placement  Message sent to [facility] at the [facility] to determine if patient has SNF benefits Message sent to Lorina Rabon and Jewel Baize with financial to determine if patient has medicare benefits and to obtain specifics  Bed search initiated  Per Tammy at [facility] patient would need to be able to ambulate with a RW in order to return     Update: Tammy with [facility] to come assess patient tomorrow at 10 am     Expected Discharge Plan and Services         Expected Discharge Date: 12/31/22                                     Social Determinants of Health (SDOH) Interventions SDOH Screenings   Food Insecurity: No Food Insecurity (12/27/2022)  Housing: Low Risk  (12/27/2022)  Transportation Needs: No Transportation Needs (12/27/2022)  Utilities: Not At Risk (12/27/2022)  Tobacco Use: High Risk (12/27/2022)    Readmission Risk Interventions     No data to display

## 2022-12-31 NOTE — Consult Note (Signed)
Pharmacy Antibiotic Note  Eric Forbes is a 67 y.o. male admitted on 12/27/2022 with cellulitis. PMH is significant for CAD, HTN, A. Fib on Eliquis s/p left atrial appendage closure device implantation done in May 2024, CVA, and T2DM. Patient presented with increased pain and redness in his left lower extremity with open blisters. Pharmacy has been consulted for vancomycin dosing for cellulitis.   New AKI 8/17, Scr 0.88 >> 1.36 > 0.97 > 1.11  Plan: Renal function has worsened, Scr 1.11 today Reduce vancomycin to 1750 mg Q24H  Goal AUC 400-550 Expected AUC 512.8 Expected Cmin 10.9 Scr 1.11, IBW 73.0 kg, Vd 0.5  Patient also on ceftriaxone 2 gm Q24H .  Height: 5\' 10"  (177.8 cm) Weight: 115.7 kg (255 lb 1.2 oz) IBW/kg (Calculated) : 73  Temp (24hrs), Avg:97.8 F (36.6 C), Min:97.3 F (36.3 C), Max:98.4 F (36.9 C)  Recent Labs  Lab 12/27/22 0933 12/28/22 0413 12/28/22 0440 12/29/22 0452 12/30/22 0528 12/31/22 0620  WBC 10.0 10.6*  --  8.9 8.2 9.4  CREATININE 0.88  --  1.36* 0.97 0.97 1.11  LATICACIDVEN 1.2  --   --   --   --   --     Estimated Creatinine Clearance: 81.2 mL/min (by C-G formula based on SCr of 1.11 mg/dL).    Allergies  Allergen Reactions   Bee Venom Anaphylaxis   Antimicrobials this admission: Vancomycin 8/16 >>  Ceftriaxone 8/16 >>  Dose adjustments this admission: 8/16 Vancomycin 1 gm Q12H >> 8/17 discontinued due to AKI 8/18 Vancomycin 1 gm Q12H >> 8/20 8/20 Vancomycin 1750 mg Q24H >>  Microbiology results: 8/16 BCx: NGTD  Thank you for allowing pharmacy to be a part of this patient's care.  [name], PharmD PGY1 Pharmacy Resident  12/31/2022 8:10 AM

## 2022-12-31 NOTE — Plan of Care (Signed)
  Problem: Education: Goal: Ability to describe self-care measures that may prevent or decrease complications (Diabetes Survival Skills Education) will improve Outcome: Progressing   Problem: Coping: Goal: Ability to adjust to condition or change in health will improve Outcome: Progressing   Problem: Metabolic: Goal: Ability to maintain appropriate glucose levels will improve Outcome: Progressing   Problem: Nutritional: Goal: Maintenance of adequate nutrition will improve Outcome: Progressing   Problem: Skin Integrity: Goal: Risk for impaired skin integrity will decrease Outcome: Progressing   

## 2022-12-31 NOTE — Progress Notes (Signed)
[facility] Progress Note  Patient: Eric Forbes    IHK:[id number]  DOA: 12/27/2022     Date of Service: the patient was seen and examined on 12/31/2022  Chief Complaint  Patient presents with   Leg infection   Brief hospital course: Jerad Gardipee is a 67 y.o. male with PMH of CAD, HTN, A. Fib on Eliquis, s/p Left Atrial Appendage Closure Device Implantation done in May 2024, CVA, type 2 diabetes, who presents to the ED due to leg infection.  Patient stated started 5 to 6 days ago, pain and redness was increasing gradually.  Patient had fever yesterday so patient was sent to the ED for further workup and management.   ED course: On arrival to the ED, patient was normotensive at 139/89 with heart rate of 83.  He was saturating at 98% on room air.  He was afebrile at 97.5. Initial workup notable for WBC of 10.0, hemoglobin 13.6, glucose of 183, creatinine 0.88 with GFR above 60.  Lactic acid 1.2.  Lower extremity Doppler negative for DVT.  Patient started on Zofran, morphine and Rocephin.  [facility] contacted for admission.   Assessment and Plan:  # Lower extremity cellulitis Patient is presenting with extensive left lower extremity cellulitis with overlying blisters, one large blister on the foot that has opened  Given extensive nature, will admit for IV antibiotics.  No evidence of sepsis at this time. Continue Rocephin 8/20 discontinued vancomycin due to slightly elevated creatinine, started doxycycline 100 mg p.o. twice daily Pharmacy consulted for dosing and trough monitoring wound care consulted Blood culture NGTD Stable to discharge on oral antibiotics when bed will be available at ALF versus SNF  # Lower extremity edema S/p Lasix 40 mg IV twice daily, discontinued on 8/20 due to slightly elevated creatinine Monitor urine output and renal functions Monitor electrolytes    # Essential hypertension 8/17 blood pressure soft Discontinue amlodipine, decreased lisinopril from 40  to 20 mg Continue sotalol Monitor BP and titrate medications accordingly   # Type 2 diabetes mellitus, HbA1c 7.2, well-controlled 8/19 Increased Semglee 45 units nightly, started NovoLog 5 units 3 times daily and continue NovoLog sliding scale.  Monitor CBG, continue diabetic diet.    # A-fib  S/p left atrial appendage closure device insertion done in May 2024 But patient was still on Eliquis I do not know why.  I discontinued Eliquis for now, patient will remain at high risk for bleeding being on triple regimen, patient is on DAPT as well Discontinue Eliquis and follow-up with cardiologist as an outpatient for further recommendation of DOAC  # Depression, continue home medications  # BPH, continue Flomax  # Generalized weakness Transferrin saturation 55%, vitamin B12 378 and vitamin D 38 Started oral iron supplement, B12 supplement and vitamin D supplement. Repeat labs after 3 to 6 months.  Body mass index is 36.6 kg/m.  Interventions:  Diet: Heart healthy/carb modified DVT Prophylaxis: Therapeutic Anticoagulation with Eliquis    Advance goals of care discussion: Full code  Family Communication: family was not present at bedside, at the time of interview.  The pt provided permission to discuss medical plan with the family. Opportunity was given to ask question and all questions were answered satisfactorily.  8/19 discussed with patient's brother over the phone  Disposition:  Pt is from ALF, admitted with LLE cellulitis, IV antibiotics, clinically improved, antibiotics can be switched to oral, stable to discharge.  TOC following, aware of discharge planning.  ALF will eval patient tomorrow a.m.  if appropriate for ALF otherwise TOC will proceed for SNF placement   Subjective: No significant events overnight, patient still has pain on the left lower extremity but generally improving, edema is improving and tenderness has improved a lot.  Overall patient is feeling better, denied  any other complaints.   Physical Exam: General: NAD, lying comfortably Appear in no distress, affect appropriate Eyes: PERRLA ENT: Oral Mucosa Clear, moist  Neck: no JVD,  Cardiovascular: S1 and S2 Present, no Murmur,  Respiratory: good respiratory effort, Bilateral Air entry equal and Decreased, no Crackles, no wheezes Abdomen: Bowel Sound present, Soft and no tenderness,  Skin: no rashes Extremities: LLE erythema and tenderness, ruptured bulla, covered with dressing.  Erythema improved and edema almost resolved after diuresis.   RLE normal Neurologic: without any new focal findings, residual left-sided weakness due to prior CVA Gait not checked due to patient safety concerns  Vitals:   12/30/22 2001 12/31/22 0418 12/31/22 0734 12/31/22 1534  BP: (!) 166/106 118/70 (!) 113/96 (!) 147/86  Pulse: 91 72 83 80  Resp: 18 18 18 18   Temp: (!) 97.3 F (36.3 C) (!) 97.3 F (36.3 C) 98.2 F (36.8 C) (!) 97.2 F (36.2 C)  TempSrc: Oral Oral    SpO2: 97% 98% (!) 89% 97%  Weight:      Height:        Intake/Output Summary (Last 24 hours) at 12/31/2022 1628 Last data filed at 12/31/2022 1300 Gross per 24 hour  Intake --  Output 900 ml  Net -900 ml   Filed Weights   12/27/22 0931  Weight: 115.7 kg    Data Reviewed: I have personally reviewed and interpreted daily labs, tele strips, imagings as discussed above. I reviewed all nursing notes, pharmacy notes, vitals, pertinent old records I have discussed plan of care as described above with RN and patient/family.  CBC: Recent Labs  Lab 12/27/22 0933 12/28/22 0413 12/29/22 0452 12/30/22 0528 12/31/22 0620  WBC 10.0 10.6* 8.9 8.2 9.4  HGB 13.6 13.2 12.6* 12.4* 12.9*  HCT 38.4* 37.3* 36.5* 34.5* 35.7*  MCV 70.2* 69.3* 70.1* 68.6* 68.4*  PLT 168 186 145* 143* 158   Basic Metabolic Panel: Recent Labs  Lab 12/27/22 0933 12/28/22 0406 12/28/22 0440 12/29/22 0452 12/30/22 0528 12/31/22 0620  NA 136  --  135 136 137 134*   K 4.1  --  5.1 4.5 4.2 5.0  CL 100  --  100 100 99 99  CO2 28  --  28 29 33* 30  GLUCOSE 183*  --  153* 253* 234* 156*  BUN 17  --  19 25* 22 22  CREATININE 0.88  --  1.36* 0.97 0.97 1.11  CALCIUM 9.2  --  9.0 9.1 9.2 9.0  MG  --  2.0  --  2.1 2.0 2.2  PHOS  --  5.5*  --  4.1 3.6 4.9*    Studies: No results found.  Scheduled Meds:  acetaminophen  500 mg Oral TID   vitamin C  500 mg Oral Daily   aspirin EC  81 mg Oral Daily   busPIRone  10 mg Oral BID   clopidogrel  75 mg Oral Daily   vitamin B-12  500 mcg Oral Daily   doxycycline  100 mg Oral Q12H   DULoxetine  90 mg Oral Daily   enoxaparin (LOVENOX) injection  55 mg Subcutaneous Q24H   insulin aspart  0-15 Units Subcutaneous TID WC   insulin aspart  0-5 Units  Subcutaneous QHS   insulin aspart  5 Units Subcutaneous TID WC   insulin glargine-yfgn  45 Units Subcutaneous QHS   iron polysaccharides  150 mg Oral Daily   lisinopril  20 mg Oral Daily   melatonin  5 mg Oral QHS   polyvinyl alcohol  2 drop Both Eyes BID   pregabalin  50 mg Oral TID   rosuvastatin  10 mg Oral QHS   sodium chloride flush  3 mL Intravenous Q12H   sotalol  80 mg Oral Q12H   tamsulosin  0.4 mg Oral QPC supper   traZODone  25 mg Oral QHS   Vitamin D (Ergocalciferol)  50,000 Units Oral Q7 days   Continuous Infusions:  sodium chloride 10 mL/hr at 12/28/22 1211   cefTRIAXone (ROCEPHIN)  IV 2 g (12/31/22 0933)   PRN Meds: sodium chloride, acetaminophen **OR** acetaminophen, albuterol, cyclobenzaprine, HYDROmorphone (DILAUDID) injection, hydrOXYzine, ondansetron **OR** ondansetron (ZOFRAN) IV, oxyCODONE, polyethylene glycol  Time spent: 35 minutes  Author: [name]. MD [facility] 12/31/2022 4:28 PM  To reach On-call, see care teams to locate the attending and reach out to them via [email or web address]. If 7PM-7AM, please contact night-coverage If you still have difficulty reaching the attending provider, please page the [facility] (Director on Call)  for [facility] on amion for assistance.

## 2023-01-01 ENCOUNTER — Other Ambulatory Visit (HOSPITAL_COMMUNITY): Payer: Self-pay

## 2023-01-01 DIAGNOSIS — L03116 Cellulitis of left lower limb: Secondary | ICD-10-CM

## 2023-01-01 LAB — BASIC METABOLIC PANEL
Anion gap: 3 — ABNORMAL LOW (ref 5–15)
BUN: 27 mg/dL — ABNORMAL HIGH (ref 8–23)
CO2: 31 mmol/L (ref 22–32)
Calcium: 8.8 mg/dL — ABNORMAL LOW (ref 8.9–10.3)
Chloride: 102 mmol/L (ref 98–111)
Creatinine, Ser: 0.97 mg/dL (ref 0.61–1.24)
GFR, Estimated: >60 mL/min (ref >60)
Glucose, Bld: 162 mg/dL — ABNORMAL HIGH (ref 70–99)
Potassium: 4 mmol/L (ref 3.5–5.1)
Sodium: 136 mmol/L (ref 135–145)

## 2023-01-01 LAB — CBC
HCT: 34.3 % — ABNORMAL LOW (ref 39.0–52.0)
Hemoglobin: 12.1 g/dL — ABNORMAL LOW (ref 13.0–17.0)
MCH: 24.4 pg — ABNORMAL LOW (ref 26.0–34.0)
MCHC: 35.3 g/dL (ref 30.0–36.0)
MCV: 69.3 fL — ABNORMAL LOW (ref 80.0–100.0)
Platelets: 145 10*3/uL — ABNORMAL LOW (ref 150–400)
RBC: 4.95 MIL/uL (ref 4.22–5.81)
RDW: 16 % — ABNORMAL HIGH (ref 11.5–15.5)
WBC: 7.4 10*3/uL (ref 4.0–10.5)
nRBC: 0 % (ref 0.0–0.2)

## 2023-01-01 LAB — GLUCOSE, CAPILLARY
Glucose-Capillary: 169 mg/dL — ABNORMAL HIGH (ref 70–99)
Glucose-Capillary: 259 mg/dL — ABNORMAL HIGH (ref 70–99)
Glucose-Capillary: 163 mg/dL — ABNORMAL HIGH (ref 70–99)
Glucose-Capillary: 170 mg/dL — ABNORMAL HIGH (ref 70–99)

## 2023-01-01 LAB — CULTURE, BLOOD (ROUTINE X 2)
Culture: NO GROWTH
Culture: NO GROWTH
Special Requests: ADEQUATE
Special Requests: ADEQUATE

## 2023-01-01 MED ORDER — OXYCODONE HCL 5 MG PO TABS
10.0000 mg | ORAL_TABLET | Freq: Four times a day (QID) | ORAL | Status: DC | PRN
  Administered 2023-01-02 – 2023-01-16 (×34): 10 mg via ORAL
  Filled 2023-01-01 (×35): qty 2

## 2023-01-01 MED ORDER — INSULIN ASPART 100 UNIT/ML IJ SOLN
8.0000 [IU] | Freq: Three times a day (TID) | INTRAMUSCULAR | Status: DC
  Administered 2023-01-01 – 2023-01-16 (×45): 8 [IU] via SUBCUTANEOUS
  Filled 2023-01-01 (×45): qty 1

## 2023-01-01 NOTE — Plan of Care (Signed)
  Problem: Coping: Goal: Ability to adjust to condition or change in health will improve Outcome: Progressing   Problem: Nutritional: Goal: Maintenance of adequate nutrition will improve Outcome: Progressing   Problem: Skin Integrity: Goal: Risk for impaired skin integrity will decrease Outcome: Progressing   

## 2023-01-01 NOTE — Progress Notes (Signed)
Physical Therapy Treatment Patient Details Name: Eric Forbes MRN: [id number] DOB: 1956-03-24 Today's Date: 01/01/2023   History of Present Illness Pt is a 67 y/o M admitted on 12/27/22 after presenting with c/o pain & redness in LLE. Pt is being treated for LLE cellulitis. PMH: a-fib on Eliquis, CVA, DM2, HTN, CAD, MI    PT Comments  Pt needing a lot of cuing, encouragement and redirection t/o the session, but ultimately did do relatively well showing ability to show significantly increased ambulation distance into the hallway (~65 ft) with walker despite fatigue and increased L LE pain.  Pt determined to show ability to transition to EOB from flat bed using rail and after much talk and then obviously heavy effort did get to sitting w/o direct assist.  Pt will benefit from continued PT to address functional limitations per POC.     If plan is discharge home, recommend the following: A little help with walking and/or transfers;A little help with bathing/dressing/bathroom;Assistance with cooking/housework;Assistance with feeding;Help with stairs or ramp for entrance;Assist for transportation   Can travel by private vehicle     No  Equipment Recommendations   (TBD)    Recommendations for Other Services       Precautions / Restrictions Precautions Precautions: Fall Restrictions Weight Bearing Restrictions: No     Mobility  Bed Mobility Overal bed mobility: Needs Assistance Bed Mobility: Supine to Sit     Supine to sit: Supervision     General bed mobility comments: transition towards R side of bed; extremely heavy use of bed rail with R UE, but with great effort (and motivation to do so w/o assist) managed to get to sitting on his own power    Transfers Overall transfer level: Needs assistance Equipment used: Rolling walker (2 wheels) Transfers: Sit to/from Stand, Bed to chair/wheelchair/BSC Sit to Stand: Contact guard assist, From elevated surface Stand pivot transfers:  Contact guard assist         General transfer comment: unable to rise from standard height bed (elevated ~2"), tending to self select using UE on walker but with heavy cuing and explanation he did use R UE on bed/arm rests.  Multiple STS efforts, hand over hand assist to get L UE appropriately onto walker handle    Ambulation/Gait Ambulation/Gait assistance: Contact guard assist (chair follow) Gait Distance (Feet): 65 Feet Assistive device: Rolling walker (2 wheels)         General Gait Details: Pt with motivation to show PT/OT that he is able to ambulate more than just a few feet at a time.  He showed poor ability to effectively bear weight on the L UE (despite reporting that at baseline he uses rollator and can effectively use breaks...?).  Pt did fatigue with the effort and had noted L sided limp that is apparent his new baseline.  Pt with general unsteadiness, did not follow cuing for slower more deliberate cadence but ultimately did not have any LOBs despite poor safety awareness or ability to effectively follow cues.   Stairs             Wheelchair Mobility     Tilt Bed    Modified Rankin (Stroke Patients Only)       Balance Overall balance assessment: Needs assistance Sitting-balance support: No upper extremity supported, Feet supported Sitting balance-Leahy Scale: Good     Standing balance support: Bilateral upper extremity supported Standing balance-Leahy Scale: Fair Standing balance comment: able to maintain static standing w/o UE, need for L  UE support during dynamic tasks                            Cognition Arousal: Alert Behavior During Therapy: WFL for tasks assessed/performed Overall Cognitive Status: Within Functional Limits for tasks assessed                                          Exercises      General Comments General comments (skin integrity, edema, etc.): Pt needing excessive cuing and redirection to stay  on task, but ultimately did show ability to tolerate more prolonged bout of ambulation and some mobility w/o need for direct assist      Pertinent Vitals/Pain Pain Assessment Pain Assessment: Faces Faces Pain Scale: Hurts little more Pain Location: L LE    Home Living                          Prior Function            PT Goals (current goals can now be found in the care plan section) Progress towards PT goals: Progressing toward goals    Frequency    Min 1X/week      PT Plan      Co-evaluation PT/OT/SLP Co-Evaluation/Treatment: Yes Reason for Co-Treatment: Necessary to address cognition/behavior during functional activity;For patient/therapist safety;To address functional/ADL transfers PT goals addressed during session: Mobility/safety with mobility;Balance OT goals addressed during session: ADL's and self-care      AM-PAC PT "6 Clicks" Mobility   Outcome Measure  Help needed turning from your back to your side while in a flat bed without using bedrails?: A Little Help needed moving from lying on your back to sitting on the side of a flat bed without using bedrails?: A Little Help needed moving to and from a bed to a chair (including a wheelchair)?: A Little Help needed standing up from a chair using your arms (e.g., wheelchair or bedside chair)?: A Little Help needed to walk in hospital room?: A Little Help needed climbing 3-5 steps with a railing? : A Lot 6 Click Score: 17    End of Session Equipment Utilized During Treatment: Gait belt Activity Tolerance: Patient tolerated treatment well Patient left: in chair;with call bell/phone within reach;with chair alarm set;with family/visitor present;with nursing/sitter in room Nurse Communication: Mobility status PT Visit Diagnosis: Pain;Unsteadiness on feet (R26.81);Muscle weakness (generalized) (M62.81);Other abnormalities of gait and mobility (R26.89);Difficulty in walking, not elsewhere classified  (R26.2) Pain - Right/Left: Left Pain - part of body: Ankle and joints of foot;Leg     Time: 1325-1400 PT Time Calculation (min) (ACUTE ONLY): 35 min  Charges:    $Gait Training: 8-22 mins PT General Charges $$ ACUTE PT VISIT: 1 Visit                     Malachi Pro, DPT 01/01/2023, 3:11 PM

## 2023-01-01 NOTE — Progress Notes (Signed)
PROGRESS NOTE    Eric Forbes  WGN:[id number] DOB: [birth year] DOA: 12/27/2022 PCP: [facility]   Assessment & Plan:   Principal Problem:   Lower extremity cellulitis Active Problems:   A-fib (HCC)   Type 2 diabetes mellitus (HCC)   Essential hypertension   Cellulitis of left lower extremity  Assessment and Plan: LLE cellulitis: continue on IV rocephin, po doxycycline. Blood cxs NGTD. Continue w/ wound care.    Lower extremity edema: lasix being held secondary to elevated Cr which has now resolved.    HTN: continue on stoalol, lisinopril. Holding amlodipine.   DM2: HbA1c 7.2, well controlled. Continue on glargine, SSI w/ accuchecks    Likely PAF: s/p left atrial appendage closure device insertion done in May 2024.High risk for bleeding as pt was on aspirin, plavix & eliquis. Eliquis was d/c as per Dr. Lucianne Muss.  Will need to follow-up with cardiologist as an outpatient for further recommendation of DOAC   Depression: severity unknown. Continue on buspar, duloxetine, trazodone    BPH: continue on flomax    Generalized weakness: PT/OT recs SNF   Vitamin B12 deficiency: continue on B12 supplement  Obesity: BMI 36.6. Would benefit from weight loss     DVT prophylaxis: lovenox Code Status:  full  Family Communication:  Disposition Plan: likely d/c to SNF  Level of care: Med-Surg Consultants:    Procedures:   Antimicrobials: rocephin, doxy   Subjective: Pt c/o leg pain   Objective: Vitals:   12/31/22 0734 12/31/22 1534 12/31/22 1925 01/01/23 0314  BP: (!) 113/96 (!) 147/86 91/78 101/73  Pulse: 83 80 84 67  Resp: 18 18 18 20   Temp: 98.2 F (36.8 C) (!) 97.2 F (36.2 C) 98.6 F (37 C) (!) 97.4 F (36.3 C)  TempSrc:   Oral Oral  SpO2: (!) 89% 97% 95% 97%  Weight:      Height:        Intake/Output Summary (Last 24 hours) at 01/01/2023 0758 Last data filed at 01/01/2023 0415 Gross per 24 hour  Intake 238 ml  Output 900 ml  Net -662 ml   Filed  Weights   12/27/22 0931  Weight: 115.7 kg    Examination:  General exam: Appears uncomfortable  Respiratory system: Clear to auscultation. Respiratory effort normal. Cardiovascular system: S1 & S2 + No rubs, gallops or clicks Gastrointestinal system: Abdomen is obese, soft and nontender. Normal bowel sounds heard. Central nervous system: Alert and awake. Moves all extremities Psychiatry: Judgement and insight appears impaired. Flat mood and affect    Data Reviewed: I have personally reviewed following labs and imaging studies  CBC: Recent Labs  Lab 12/28/22 0413 12/29/22 0452 12/30/22 0528 12/31/22 0620 01/01/23 0555  WBC 10.6* 8.9 8.2 9.4 7.4  HGB 13.2 12.6* 12.4* 12.9* 12.1*  HCT 37.3* 36.5* 34.5* 35.7* 34.3*  MCV 69.3* 70.1* 68.6* 68.4* 69.3*  PLT 186 145* 143* 158 145*   Basic Metabolic Panel: Recent Labs  Lab 12/28/22 0406 12/28/22 0440 12/29/22 0452 12/30/22 0528 12/31/22 0620 01/01/23 0555  NA  --  135 136 137 134* 136  K  --  5.1 4.5 4.2 5.0 4.0  CL  --  100 100 99 99 102  CO2  --  28 29 33* 30 31  GLUCOSE  --  153* 253* 234* 156* 162*  BUN  --  19 25* 22 22 27*  CREATININE  --  1.36* 0.97 0.97 1.11 0.97  CALCIUM  --  9.0 9.1 9.2 9.0 8.8*  MG 2.0  --  2.1 2.0 2.2  --   PHOS 5.5*  --  4.1 3.6 4.9*  --    GFR: Estimated Creatinine Clearance: 92.9 mL/min (by C-G formula based on SCr of 0.97 mg/dL). Liver Function Tests: No results for input(s): "AST", "ALT", "ALKPHOS", "BILITOT", "PROT", "ALBUMIN" in the last 168 hours. No results for input(s): "LIPASE", "AMYLASE" in the last 168 hours. No results for input(s): "AMMONIA" in the last 168 hours. Coagulation Profile: No results for input(s): "INR", "PROTIME" in the last 168 hours. Cardiac Enzymes: No results for input(s): "CKTOTAL", "CKMB", "CKMBINDEX", "TROPONINI" in the last 168 hours. BNP (last 3 results) No results for input(s): "PROBNP" in the last 8760 hours. HbA1C: No results for input(s):  "HGBA1C" in the last 72 hours. CBG: Recent Labs  Lab 12/30/22 2104 12/31/22 0735 12/31/22 1125 12/31/22 1648 12/31/22 2134  GLUCAP 191* 163* 287* 260* 404*   Lipid Profile: No results for input(s): "CHOL", "HDL", "LDLCALC", "TRIG", "CHOLHDL", "LDLDIRECT" in the last 72 hours. Thyroid Function Tests: No results for input(s): "TSH", "T4TOTAL", "FREET4", "T3FREE", "THYROIDAB" in the last 72 hours. Anemia Panel: No results for input(s): "VITAMINB12", "FOLATE", "FERRITIN", "TIBC", "IRON", "RETICCTPCT" in the last 72 hours. Sepsis Labs: Recent Labs  Lab 12/27/22 0933  LATICACIDVEN 1.2    Recent Results (from the past 240 hour(s))  Blood culture (routine x 2)     Status: None (Preliminary result)   Collection Time: 12/27/22 12:05 PM   Specimen: BLOOD  Result Value Ref Range Status   Specimen Description BLOOD RIGHT ANTECUBITAL  Final   Special Requests   Final    BOTTLES DRAWN AEROBIC AND ANAEROBIC Blood Culture adequate volume   Culture   Final    NO GROWTH 4 DAYS Performed at [facility], [address], [place], [place] [address]    Report Status PENDING  Incomplete  Blood culture (routine x 2)     Status: None (Preliminary result)   Collection Time: 12/27/22 12:05 PM   Specimen: BLOOD  Result Value Ref Range Status   Specimen Description BLOOD BLOOD LEFT FOREARM  Final   Special Requests   Final    BOTTLES DRAWN AEROBIC AND ANAEROBIC Blood Culture adequate volume   Culture   Final    NO GROWTH 4 DAYS Performed at [facility], [address] [place], [place] [address]    Report Status PENDING  Incomplete         Radiology Studies: No results found.      Scheduled Meds:  acetaminophen  500 mg Oral TID   vitamin C  500 mg Oral Daily   aspirin EC  81 mg Oral Daily   busPIRone  10 mg Oral BID   clopidogrel  75 mg Oral Daily   vitamin B-12  500 mcg Oral Daily   doxycycline  100 mg Oral Q12H   DULoxetine  90 mg Oral Daily    enoxaparin (LOVENOX) injection  55 mg Subcutaneous Q24H   insulin aspart  0-15 Units Subcutaneous TID WC   insulin aspart  0-5 Units Subcutaneous QHS   insulin aspart  5 Units Subcutaneous TID WC   insulin glargine-yfgn  45 Units Subcutaneous QHS   iron polysaccharides  150 mg Oral Daily   lisinopril  20 mg Oral Daily   melatonin  5 mg Oral QHS   polyvinyl alcohol  2 drop Both Eyes BID   pregabalin  50 mg Oral TID   rosuvastatin  10 mg Oral QHS  sodium chloride flush  3 mL Intravenous Q12H   sotalol  80 mg Oral Q12H   tamsulosin  0.4 mg Oral QPC supper   traZODone  25 mg Oral QHS   Vitamin D (Ergocalciferol)  50,000 Units Oral Q7 days   Continuous Infusions:  sodium chloride 10 mL/hr at 12/28/22 1211   cefTRIAXone (ROCEPHIN)  IV 2 g (12/31/22 0933)     LOS: 4 days    Time spent: 25 mins     Charise Killian, MD [facility] Pager [phone]  If 7PM-7AM, please contact night-coverage [email or web address] 01/01/2023, 7:58 AM

## 2023-01-01 NOTE — Consult Note (Signed)
PHARMACY CONSULT NOTE - ELECTROLYTES  Pharmacy Consult for Electrolyte Monitoring and Replacement   Recent Labs: Potassium (mmol/L)  Date Value  01/01/2023 4.0   Magnesium (mg/dL)  Date Value  40/98/1191 2.2   Calcium (mg/dL)  Date Value  47/82/9562 8.8 (L)   Albumin (g/dL)  Date Value  13/12/6576 3.7   Phosphorus (mg/dL)  Date Value  46/96/2952 4.9 (H)   Sodium (mmol/L)  Date Value  01/01/2023 136   Height: 5\' 10"  (177.8 cm) Weight: 115.7 kg (255 lb 1.2 oz) IBW/kg (Calculated) : 73 Estimated Creatinine Clearance: 92.9 mL/min (by C-G formula based on SCr of 0.97 mg/dL).  Assessment  Eric Forbes is a 67 y.o. male presenting with lower extremity cellulitis. PMH significant for Afib on apixaban, CVA, DM, HTN, CAD. Pharmacy has been consulted to monitor and replace electrolytes.  Diet: heart healthy / carb modified MIVF: N/A Pertinent medications: Sotalol, IV Lasix  Goal of Therapy: K+ > 4 and Mg > 2  Plan:  No replacement needed at this time F/u with AM labs.   Thank you for allowing pharmacy to be a part of this patient's care.  [name], PharmD PGY1 Pharmacy Resident 01/01/2023 7:20 AM

## 2023-01-01 NOTE — Progress Notes (Signed)
Patient is stable this shift. Night shift changed bandage to LLE this morning. Patient in my assessment does appear to have something cognitive going on. He is argumentative to certain staff and keeps saying "why do people keep accusing me of things I did not do" and "I am out of my head lately." Family visited at bedside stating "yes he has some stuff going on in his head."  Plan is to go to rehab before back to previous place of stay. Dressing to right AC IV changed this shift. Previous one soiled and falling off. He has had PO pain meds 1 time thus far this shift. Patient is appreciative of care.

## 2023-01-01 NOTE — Progress Notes (Signed)
Mobility Specialist - Progress Note   01/01/23 1054  Mobility  Activity Dangled on edge of bed;Ambulated with assistance in room;Transferred from bed to chair  Level of Assistance Contact guard assist, steadying assist  Assistive Device Front wheel walker  Distance Ambulated (ft) 4 ft  Activity Response Tolerated well  $Mobility charge 1 Mobility  Mobility Specialist Start Time (ACUTE ONLY) 1023  Mobility Specialist Stop Time (ACUTE ONLY) 1051  Mobility Specialist Time Calculation (min) (ACUTE ONLY) 28 min   Pt supine upon entry, utilizing RA. Pt requesting to transfer to the recliner this date. Pt completed bed mob MaxA to bring LLE EOB and MinA to bring trunk upright. Pt dangled EOB for ~10 min-- redirection to task required. Pt STS to RW WBOS, heavy WB through the RUE on the RW upon stranding ( asking MS to "hold down the front" of the RW and "pull against" him upon standing). Pt amb to the recliner CGA, requiring manual movement of the RW and min sequencing cues when turning. Pt left seated in the recliner with alarm set and needs within reach. RN notified.   Zetta Bills Mobility Specialist 01/01/23 11:10 AM

## 2023-01-01 NOTE — Inpatient Diabetes Management (Signed)
Inpatient Diabetes Program Recommendations  AACE/ADA: New Consensus Statement on Inpatient Glycemic Control (2015)  Target Ranges:  Prepandial:   less than 140 mg/dL      Peak postprandial:   less than 180 mg/dL (1-2 hours)      Critically ill patients:  140 - 180 mg/dL   Lab Results  Component Value Date   GLUCAP 169 (H) 01/01/2023   HGBA1C 7.2 (H) 12/27/2022    Latest Reference Range & Units 12/31/22 07:35 12/31/22 11:25 12/31/22 16:48 12/31/22 21:34 01/01/23 08:01  Glucose-Capillary 70 - 99 mg/dL 034 (H) 742 (H) 595 (H) 404 (H) 169 (H)  (H): Data is abnormally high  Review of Glycemic Control  Diabetes history: DM2 Outpatient Diabetes medications: Semglee 70 units q hs. Novolog 28 units tid meal coverage Current orders for Inpatient glycemic control: Semglee 45 units at bedtime, Novolog 0-15 units tid. 0-5 units hs   Inpatient Diabetes Program Recommendations:  Postprandial CBGs elevated >180.  Please consider: -Increase Novolog to 8 units tid meal coverage if eats 50% meals  Thank you, Eric Forbes. Eric Palinkas, RN, MSN, CDE  Diabetes Coordinator Inpatient Glycemic Control Team Team Pager [phone] (8am-5pm) 01/01/2023 10:14 AM

## 2023-01-01 NOTE — Progress Notes (Signed)
Occupational Therapy Treatment Patient Details Name: Eric Forbes MRN: [id number] DOB: [birth year] Today's Date: 01/01/2023   History of present illness Pt is a 67 y/o M admitted on 12/27/22 after presenting with c/o pain & redness in LLE. Pt is being treated for LLE cellulitis. PMH: a-fib on Eliquis, CVA, DM2, HTN, CAD, MI   OT comments  Mr. Lohrmann was seen for OT/PT co-treatment on this date. Upon arrival to room pt supine in bed, agreeable to Tx session. OT facilitated ADL management as described below. See ADL section for additional details regarding occupational performance. Pt continues to be functionally limited by increased pain with mobility, decreased activity tolerance, and decreased LB access. Pt return verbalizes understanding of education provided t/o session. Pt is progressing toward OT goals and continues to benefit from skilled OT services to maximize return to PLOF and minimize risk of future falls, injury, caregiver burden, and readmission. Will continue to follow POC as written. Discharge recommendation remains appropriate.        If plan is discharge home, recommend the following:  Two people to help with walking and/or transfers;Two people to help with bathing/dressing/bathroom   Equipment Recommendations  Other (comment) (defer)    Recommendations for Other Services      Precautions / Restrictions Precautions Precautions: Fall Restrictions Weight Bearing Restrictions: No       Mobility Bed Mobility Overal bed mobility: Needs Assistance Bed Mobility: Supine to Sit     Supine to sit: Supervision Sit to supine: Max assist   General bed mobility comments: transition towards R side of bed; extremely heavy use of bed rail with R UE, but with great effort (and motivation to do so w/o assist) managed to get to sitting on his own power    Transfers Overall transfer level: Needs assistance Equipment used: Rolling walker (2 wheels) Transfers: Sit to/from Stand,  Bed to chair/wheelchair/BSC Sit to Stand: Contact guard assist, From elevated surface Stand pivot transfers: Contact guard assist         General transfer comment: unable to rise from standard height bed (elevated ~2"), tending to self select using UE on walker but with heavy cuing and explanation he did use R UE on bed/arm rests.  Multiple STS efforts, hand over hand assist to get L UE appropriately onto walker handle     Balance Overall balance assessment: Needs assistance Sitting-balance support: No upper extremity supported, Feet supported Sitting balance-Leahy Scale: Good Sitting balance - Comments: steady reaching within BOS and with dynamic sitting balance during functional activity.   Standing balance support: Bilateral upper extremity supported Standing balance-Leahy Scale: Fair Standing balance comment: able to maintain static standing w/o UE, need for L UE support during dynamic tasks                           ADL either performed or assessed with clinical judgement   ADL Overall ADL's : Needs assistance/impaired                                       General ADL Comments: MAX A for LB dressing while seated EOB, MOD A to don hospital gown around back. Close CGA with STS attempts and cueing for safety required t/o session. Pt demonstrates poor safety awareness when it comes to functional transfers and mobility. Requires consistent education t/o session. Close supervision during functional mobility with RW  and +2 for chair follow.    Extremity/Trunk Assessment              Vision Patient Visual Report: No change from baseline     Perception     Praxis      Cognition Arousal: Alert Behavior During Therapy: WFL for tasks assessed/performed Overall Cognitive Status: Within Functional Limits for tasks assessed                                 General Comments: Vc's for safety and to redirect pt during session         Exercises Other Exercises Other Exercises: Pt educated on role of OT in acute setting as well as provided with ongoing safety education t/o functional activity as described above.    Shoulder Instructions       General Comments Pt needing excessive cuing and redirection to stay on task, but ultimately did show ability to tolerate more prolonged bout of ambulation and some mobility w/o need for direct assist    Pertinent Vitals/ Pain       Pain Assessment Pain Assessment: Faces Faces Pain Scale: Hurts little more Pain Location: L LE Pain Descriptors / Indicators: Discomfort, Guarding Pain Intervention(s): Limited activity within patient's tolerance, Monitored during session, Repositioned, Patient requesting pain meds-RN notified  Home Living                                          Prior Functioning/Environment              Frequency  Min 1X/week        Progress Toward Goals  OT Goals(current goals can now be found in the care plan section)  Progress towards OT goals: Progressing toward goals  Acute Rehab OT Goals Patient Stated Goal: improve pain OT Goal Formulation: With patient Time For Goal Achievement: 01/11/23 Potential to Achieve Goals: Fair  Plan      Co-evaluation    PT/OT/SLP Co-Evaluation/Treatment: Yes Reason for Co-Treatment: Necessary to address cognition/behavior during functional activity;For patient/therapist safety;To address functional/ADL transfers PT goals addressed during session: Mobility/safety with mobility;Balance OT goals addressed during session: ADL's and self-care      AM-PAC OT "6 Clicks" Daily Activity     Outcome Measure   Help from another person eating meals?: None Help from another person taking care of personal grooming?: A Little Help from another person toileting, which includes using toliet, bedpan, or urinal?: A Lot Help from another person bathing (including washing, rinsing, drying)?: A  Lot Help from another person to put on and taking off regular upper body clothing?: A Little Help from another person to put on and taking off regular lower body clothing?: A Lot 6 Click Score: 16    End of Session Equipment Utilized During Treatment: Gait belt;Rolling walker (2 wheels)  OT Visit Diagnosis: Other abnormalities of gait and mobility (R26.89);Muscle weakness (generalized) (M62.81)   Activity Tolerance Patient tolerated treatment well   Patient Left in bed;with call bell/phone within reach;in chair;with chair alarm set;with family/visitor present   Nurse Communication          Time: 1324-1400 OT Time Calculation (min): 36 min  Charges: OT General Charges $OT Visit: 1 Visit OT Treatments $Self Care/Home Management : 8-22 mins  Rockney Ghee, M.S., OTR/L 01/01/23, 3:49 PM

## 2023-01-02 DIAGNOSIS — L03116 Cellulitis of left lower limb: Secondary | ICD-10-CM | POA: Diagnosis not present

## 2023-01-02 LAB — BASIC METABOLIC PANEL
Anion gap: 8 (ref 5–15)
BUN: 20 mg/dL (ref 8–23)
CO2: 29 mmol/L (ref 22–32)
Calcium: 9.1 mg/dL (ref 8.9–10.3)
Chloride: 102 mmol/L (ref 98–111)
Creatinine, Ser: 0.85 mg/dL (ref 0.61–1.24)
GFR, Estimated: >60 mL/min (ref >60)
Glucose, Bld: 159 mg/dL — ABNORMAL HIGH (ref 70–99)
Potassium: 4.1 mmol/L (ref 3.5–5.1)
Sodium: 139 mmol/L (ref 135–145)

## 2023-01-02 LAB — CBC
HCT: 34.2 % — ABNORMAL LOW (ref 39.0–52.0)
Hemoglobin: 12.2 g/dL — ABNORMAL LOW (ref 13.0–17.0)
MCH: 24.1 pg — ABNORMAL LOW (ref 26.0–34.0)
MCHC: 35.7 g/dL (ref 30.0–36.0)
MCV: 67.6 fL — ABNORMAL LOW (ref 80.0–100.0)
Platelets: 134 10*3/uL — ABNORMAL LOW (ref 150–400)
RBC: 5.06 MIL/uL (ref 4.22–5.81)
RDW: 15.9 % — ABNORMAL HIGH (ref 11.5–15.5)
WBC: 7.1 10*3/uL (ref 4.0–10.5)
nRBC: 0 % (ref 0.0–0.2)

## 2023-01-02 LAB — GLUCOSE, CAPILLARY
Glucose-Capillary: 147 mg/dL — ABNORMAL HIGH (ref 70–99)
Glucose-Capillary: 227 mg/dL — ABNORMAL HIGH (ref 70–99)
Glucose-Capillary: 172 mg/dL — ABNORMAL HIGH (ref 70–99)
Glucose-Capillary: 200 mg/dL — ABNORMAL HIGH (ref 70–99)

## 2023-01-02 MED ORDER — DICLOFENAC SODIUM 1 % EX GEL
2.0000 g | Freq: Four times a day (QID) | CUTANEOUS | Status: DC | PRN
  Administered 2023-01-02 – 2023-01-16 (×5): 2 g via TOPICAL
  Filled 2023-01-02: qty 100

## 2023-01-02 MED ORDER — DIPHENHYDRAMINE-ZINC ACETATE 2-0.1 % EX CREA
TOPICAL_CREAM | Freq: Two times a day (BID) | CUTANEOUS | Status: DC | PRN
  Filled 2023-01-02: qty 1

## 2023-01-02 MED ORDER — PSYLLIUM 95 % PO PACK
1.0000 | PACK | Freq: Every day | ORAL | Status: DC | PRN
  Administered 2023-01-02: 1 via ORAL
  Filled 2023-01-02 (×3): qty 1

## 2023-01-02 NOTE — Plan of Care (Signed)
  Problem: Education: Goal: Ability to describe self-care measures that may prevent or decrease complications (Diabetes Survival Skills Education) will improve Outcome: Progressing   Problem: Fluid Volume: Goal: Ability to maintain a balanced intake and output will improve Outcome: Progressing

## 2023-01-02 NOTE — Progress Notes (Signed)
PROGRESS NOTE    Eric Forbes  ZOX:[id number] DOB: [birth year] DOA: 12/27/2022 PCP: [facility]   Assessment & Plan:   Principal Problem:   Lower extremity cellulitis Active Problems:   A-fib (HCC)   Type 2 diabetes mellitus (HCC)   Essential hypertension   Cellulitis of left lower extremity  Assessment and Plan: LLE cellulitis: will complete abx course after today's doses of abxs. Continue w/ wound care    Lower extremity edema: lasix being held secondary to elevated Cr which has now resolved.    HTN: continue on lisinopril, sotalol. Holding amlodipine    DM2: fair control, HbA1c 7.2. Continue on glargine, SSI w/ accuchecks    Likely PAF: s/p left atrial appendage closure device insertion done in May 2024.High risk for bleeding as pt was on aspirin, plavix & eliquis. Eliquis was d/c as per Dr. Lucianne Muss.  Will need to follow-up with cardiologist as an outpatient for further recommendation of DOAC   Depression: severity unknown. Continue on duloxetine, buspar, trazodone    BPH: continue on flomax   Generalized weakness: PT/OT recs SNF. CM is working on this    Vitamin B12 deficiency: continue on B12 supplement  Obesity: BMI 36.6. Would benefit from weight loss     DVT prophylaxis: lovenox Code Status:  full  Family Communication:  Disposition Plan: likely d/c to SNF  Level of care: Med-Surg Consultants:    Procedures:   Antimicrobials:   Subjective: Pt c/o intermittent itchy back   Objective: Vitals:   01/01/23 1634 01/01/23 2009 01/02/23 0218 01/02/23 0752  BP: 127/82 (!) 153/85 (!) 143/89 (!) 152/97  Pulse: 87 91 73   Resp: 20 20 20    Temp: 97.6 F (36.4 C) 98.6 F (37 C) 97.7 F (36.5 C) 98.2 F (36.8 C)  TempSrc: Oral Oral Oral Oral  SpO2: 99% 97% 98% 98%  Weight:      Height:        Intake/Output Summary (Last 24 hours) at 01/02/2023 0842 Last data filed at 01/02/2023 0600 Gross per 24 hour  Intake 960 ml  Output 1020 ml  Net -60 ml    Filed Weights   12/27/22 0931  Weight: 115.7 kg    Examination:  General exam: Appears comfortable  Respiratory system: clear breath sounds b/l  Cardiovascular system: S1/S2+. No rubs or clicks  Gastrointestinal system: abd is soft, NT, obese & hypoactive bowel sounds  Central nervous system: alert & awake. Moves all extremities  Psychiatry: Judgement and insight appears improved. Appropriate mood and affect    Data Reviewed: I have personally reviewed following labs and imaging studies  CBC: Recent Labs  Lab 12/29/22 0452 12/30/22 0528 12/31/22 0620 01/01/23 0555 01/02/23 0620  WBC 8.9 8.2 9.4 7.4 7.1  HGB 12.6* 12.4* 12.9* 12.1* 12.2*  HCT 36.5* 34.5* 35.7* 34.3* 34.2*  MCV 70.1* 68.6* 68.4* 69.3* 67.6*  PLT 145* 143* 158 145* 134*   Basic Metabolic Panel: Recent Labs  Lab 12/28/22 0406 12/28/22 0440 12/29/22 0452 12/30/22 0528 12/31/22 0620 01/01/23 0555 01/02/23 0620  NA  --    < > 136 137 134* 136 139  K  --    < > 4.5 4.2 5.0 4.0 4.1  CL  --    < > 100 99 99 102 102  CO2  --    < > 29 33* 30 31 29   GLUCOSE  --    < > 253* 234* 156* 162* 159*  BUN  --    < >  25* 22 22 27* 20  CREATININE  --    < > 0.97 0.97 1.11 0.97 0.85  CALCIUM  --    < > 9.1 9.2 9.0 8.8* 9.1  MG 2.0  --  2.1 2.0 2.2  --   --   PHOS 5.5*  --  4.1 3.6 4.9*  --   --    < > = values in this interval not displayed.   GFR: Estimated Creatinine Clearance: 106 mL/min (by C-G formula based on SCr of 0.85 mg/dL). Liver Function Tests: No results for input(s): "AST", "ALT", "ALKPHOS", "BILITOT", "PROT", "ALBUMIN" in the last 168 hours. No results for input(s): "LIPASE", "AMYLASE" in the last 168 hours. No results for input(s): "AMMONIA" in the last 168 hours. Coagulation Profile: No results for input(s): "INR", "PROTIME" in the last 168 hours. Cardiac Enzymes: No results for input(s): "CKTOTAL", "CKMB", "CKMBINDEX", "TROPONINI" in the last 168 hours. BNP (last 3 results) No results  for input(s): "PROBNP" in the last 8760 hours. HbA1C: No results for input(s): "HGBA1C" in the last 72 hours. CBG: Recent Labs  Lab 01/01/23 0801 01/01/23 1154 01/01/23 1713 01/01/23 2047 01/02/23 0806  GLUCAP 169* 259* 163* 170* 147*   Lipid Profile: No results for input(s): "CHOL", "HDL", "LDLCALC", "TRIG", "CHOLHDL", "LDLDIRECT" in the last 72 hours. Thyroid Function Tests: No results for input(s): "TSH", "T4TOTAL", "FREET4", "T3FREE", "THYROIDAB" in the last 72 hours. Anemia Panel: No results for input(s): "VITAMINB12", "FOLATE", "FERRITIN", "TIBC", "IRON", "RETICCTPCT" in the last 72 hours. Sepsis Labs: Recent Labs  Lab 12/27/22 0933  LATICACIDVEN 1.2    Recent Results (from the past 240 hour(s))  Blood culture (routine x 2)     Status: None   Collection Time: 12/27/22 12:05 PM   Specimen: BLOOD  Result Value Ref Range Status   Specimen Description BLOOD RIGHT ANTECUBITAL  Final   Special Requests   Final    BOTTLES DRAWN AEROBIC AND ANAEROBIC Blood Culture adequate volume   Culture   Final    NO GROWTH 5 DAYS Performed at [facility], [address], [place], [place] [address]    Report Status 01/01/2023 FINAL  Final  Blood culture (routine x 2)     Status: None   Collection Time: 12/27/22 12:05 PM   Specimen: BLOOD  Result Value Ref Range Status   Specimen Description BLOOD BLOOD LEFT FOREARM  Final   Special Requests   Final    BOTTLES DRAWN AEROBIC AND ANAEROBIC Blood Culture adequate volume   Culture   Final    NO GROWTH 5 DAYS Performed at [facility], [address], [place], [place] [address]    Report Status 01/01/2023 FINAL  Final         Radiology Studies: No results found.      Scheduled Meds:  acetaminophen  500 mg Oral TID   vitamin C  500 mg Oral Daily   aspirin EC  81 mg Oral Daily   busPIRone  10 mg Oral BID   clopidogrel  75 mg Oral Daily   vitamin B-12  500 mcg Oral Daily   doxycycline  100 mg Oral  Q12H   DULoxetine  90 mg Oral Daily   enoxaparin (LOVENOX) injection  55 mg Subcutaneous Q24H   insulin aspart  0-15 Units Subcutaneous TID WC   insulin aspart  0-5 Units Subcutaneous QHS   insulin aspart  8 Units Subcutaneous TID WC   insulin glargine-yfgn  45 Units Subcutaneous QHS  lisinopril  20 mg Oral Daily   melatonin  5 mg Oral QHS   polyvinyl alcohol  2 drop Both Eyes BID   pregabalin  50 mg Oral TID   rosuvastatin  10 mg Oral QHS   sodium chloride flush  3 mL Intravenous Q12H   sotalol  80 mg Oral Q12H   tamsulosin  0.4 mg Oral QPC supper   traZODone  25 mg Oral QHS   Vitamin D (Ergocalciferol)  50,000 Units Oral Q7 days   Continuous Infusions:  sodium chloride 10 mL/hr at 12/28/22 1211   cefTRIAXone (ROCEPHIN)  IV 2 g (01/02/23 0824)     LOS: 5 days    Time spent: 25 mins     Charise Killian, MD [facility] Pager [phone]  If 7PM-7AM, please contact night-coverage [email or web address] 01/02/2023, 8:42 AM

## 2023-01-02 NOTE — TOC Progression Note (Signed)
Transition of Care [facility]) - Progression Note    Patient Details  Name: Eric Forbes MRN: [id number] Date of Birth: 1955/07/02  Transition of Care [facility]) CM/SW Contact  [name], RN Phone Number: 01/02/2023, 9:11 AM  Clinical Narrative:     Tammy with [facility] completed in person assessment and state patient will need STR prior to return  Bed offers presented to patient.  Patient requests that I call sister [name] and let her decide  Bed offers presented to [name] she accepts bed at [facility].   Accepted in HUB        Expected Discharge Plan and Services         Expected Discharge Date: 12/31/22                                     Social Determinants of Health (SDOH) Interventions SDOH Screenings   Food Insecurity: No Food Insecurity (12/27/2022)  Housing: Low Risk  (12/27/2022)  Transportation Needs: No Transportation Needs (12/27/2022)  Utilities: Not At Risk (12/27/2022)  Tobacco Use: High Risk (12/27/2022)    Readmission Risk Interventions     No data to display

## 2023-01-02 NOTE — Consult Note (Signed)
PHARMACY CONSULT NOTE - ELECTROLYTES  Pharmacy Consult for Electrolyte Monitoring and Replacement   Recent Labs: Potassium (mmol/L)  Date Value  01/02/2023 4.1   Magnesium (mg/dL)  Date Value  62/95/2841 2.2   Calcium (mg/dL)  Date Value  32/44/0102 9.1   Albumin (g/dL)  Date Value  72/53/6644 3.7   Phosphorus (mg/dL)  Date Value  03/47/4259 4.9 (H)   Sodium (mmol/L)  Date Value  01/02/2023 139   Height: 5\' 10"  (177.8 cm) Weight: 115.7 kg (255 lb 1.2 oz) IBW/kg (Calculated) : 73 Estimated Creatinine Clearance: 106 mL/min (by C-G formula based on SCr of 0.85 mg/dL).  Assessment  Eric Forbes is a 67 y.o. male presenting with lower extremity cellulitis. PMH significant for Afib on apixaban, CVA, DM, HTN, CAD. Pharmacy has been consulted to monitor and replace electrolytes.  Diet: heart healthy / carb modified MIVF: N/A Pertinent medications: Sotalol, IV Lasix  Goal of Therapy: K+ > 4 and Mg > 2  Plan:  No replacement needed at this time F/u with AM labs.   Thank you for allowing pharmacy to be a part of this patient's care.  [name], PharmD PGY1 Pharmacy Resident 01/02/2023 7:24 AM

## 2023-01-02 NOTE — TOC Progression Note (Signed)
Transition of Care [facility]) - Progression Note    Patient Details  Name: Eric Forbes MRN: [id number] Date of Birth: [birth year]  [facility] CM/SW Contact  [name], RN Phone Number: 01/02/2023, 9:13 AM  Clinical Narrative:    Per MD patient medically ready for auth to be initiated Auth initiated  in [employer] portal, and Tiffany at [employer] notified         Expected Discharge Plan and Services         Expected Discharge Date: 12/31/22                                     Social Determinants of Health (SDOH) Interventions SDOH Screenings   Food Insecurity: No Food Insecurity (12/27/2022)  Housing: Low Risk  (12/27/2022)  Transportation Needs: No Transportation Needs (12/27/2022)  Utilities: Not At Risk (12/27/2022)  Tobacco Use: High Risk (12/27/2022)    Readmission Risk Interventions     No data to display

## 2023-01-02 NOTE — TOC Progression Note (Signed)
Transition of Care [facility]) - Progression Note    Patient Details  Name: Eric Forbes MRN: [id number] Date of Birth: [birth year]  Transition of Care [facility]) CM/SW Contact  [name], RN Phone Number: 01/02/2023, 2:46 PM  Clinical Narrative:    Insurance auth pending         Expected Discharge Plan and Services         Expected Discharge Date: 12/31/22                                     Social Determinants of Health (SDOH) Interventions SDOH Screenings   Food Insecurity: No Food Insecurity (12/27/2022)  Housing: Low Risk  (12/27/2022)  Transportation Needs: No Transportation Needs (12/27/2022)  Utilities: Not At Risk (12/27/2022)  Tobacco Use: High Risk (12/27/2022)    Readmission Risk Interventions     No data to display

## 2023-01-03 DIAGNOSIS — L03116 Cellulitis of left lower limb: Secondary | ICD-10-CM | POA: Diagnosis not present

## 2023-01-03 LAB — GLUCOSE, CAPILLARY
Glucose-Capillary: 132 mg/dL — ABNORMAL HIGH (ref 70–99)
Glucose-Capillary: 228 mg/dL — ABNORMAL HIGH (ref 70–99)
Glucose-Capillary: 193 mg/dL — ABNORMAL HIGH (ref 70–99)
Glucose-Capillary: 164 mg/dL — ABNORMAL HIGH (ref 70–99)

## 2023-01-03 LAB — PHOSPHORUS: Phosphorus: 3.8 mg/dL (ref 2.5–4.6)

## 2023-01-03 LAB — BASIC METABOLIC PANEL
Anion gap: 14 (ref 5–15)
BUN: 23 mg/dL (ref 8–23)
CO2: 26 mmol/L (ref 22–32)
Calcium: 9.2 mg/dL (ref 8.9–10.3)
Chloride: 99 mmol/L (ref 98–111)
Creatinine, Ser: 0.93 mg/dL (ref 0.61–1.24)
GFR, Estimated: >60 mL/min (ref >60)
Glucose, Bld: 127 mg/dL — ABNORMAL HIGH (ref 70–99)
Potassium: 4.3 mmol/L (ref 3.5–5.1)
Sodium: 139 mmol/L (ref 135–145)

## 2023-01-03 LAB — MAGNESIUM: Magnesium: 2.2 mg/dL (ref 1.7–2.4)

## 2023-01-03 NOTE — Progress Notes (Signed)
Pt called for pain medication, I will hold med. for now, pt is snoring/sleeping.

## 2023-01-03 NOTE — Consult Note (Signed)
PHARMACY CONSULT NOTE - ELECTROLYTES  Pharmacy Consult for Electrolyte Monitoring and Replacement   Recent Labs: Potassium (mmol/L)  Date Value  01/03/2023 4.3   Magnesium (mg/dL)  Date Value  24/40/1027 2.2   Calcium (mg/dL)  Date Value  25/36/6440 9.2   Albumin (g/dL)  Date Value  34/74/2595 3.7   Phosphorus (mg/dL)  Date Value  63/87/5643 3.8   Sodium (mmol/L)  Date Value  01/03/2023 139   Height: 5\' 10"  (177.8 cm) Weight: 115.7 kg (255 lb 1.2 oz) IBW/kg (Calculated) : 73 Estimated Creatinine Clearance: 96.9 mL/min (by C-G formula based on SCr of 0.93 mg/dL).  Assessment  Eric Forbes is a 67 y.o. male presenting with lower extremity cellulitis. PMH significant for Afib on apixaban, CVA, DM, HTN, CAD. Pharmacy has been consulted to monitor and replace electrolytes.  Diet: heart healthy / carb modified MIVF: N/A Pertinent medications: Sotalol, IV Lasix  Goal of Therapy: K+ > 4 and Mg > 2  Plan:  Patient has not required electrolyte replacement while consult has been placed. Pharmacy will sign off at this time and continue to monitor electrolytes peripherally.   Thank you for allowing pharmacy to be a part of this patient's care.  [name], PharmD PGY1 Pharmacy Resident 01/03/2023 8:37 AM

## 2023-01-03 NOTE — Progress Notes (Signed)
Mobility Specialist - Progress Note   01/03/23 1151  Mobility  Activity Stood at bedside;Ambulated with assistance in room;Transferred from bed to chair  Level of Assistance Standby assist, set-up cues, supervision of patient - no hands on  Assistive Device Front wheel walker  Distance Ambulated (ft) 6 ft  Activity Response Tolerated well  $Mobility charge 1 Mobility  Mobility Specialist Start Time (ACUTE ONLY) 1133  Mobility Specialist Stop Time (ACUTE ONLY) 1150  Mobility Specialist Time Calculation (min) (ACUTE ONLY) 17 min   Pt sitting EOB upon entry, utilizing RA. Pt STS to RW ModA, requiring physical assistance to place the LUE on the RW. Pt amb to the recliner MinG-SBA, tolerated well. Pt left seated in the recliner with alarm set and needs within reach. RN notified.  Zetta Bills Mobility Specialist 01/03/23 11:57 AM

## 2023-01-03 NOTE — Progress Notes (Signed)
Physical Therapy Treatment Patient Details Name: Eric Forbes MRN: [id number] DOB: Dec 02, 1955 Today's Date: 01/03/2023   History of Present Illness Pt is a 67 y/o M admitted on 12/27/22 after presenting with c/o pain & redness in LLE. Pt is being treated for LLE cellulitis. PMH: a-fib on Eliquis, CVA, DM2, HTN, CAD, MI    PT Comments  Pt declined transfers/gait this session secondary to awaiting dressing application/change to LLE.  With min encouragement pt agreed to below therex and put forth good effort throughout.  Min verbal cues given for proper form and breathing technique during the session with no adverse symptoms reported other than baseline LLE pain that did not increase with below therex.  Pt will benefit from continued PT services upon discharge to safely address deficits listed in patient problem list for decreased caregiver assistance and eventual return to PLOF.     If plan is discharge home, recommend the following: A little help with walking and/or transfers;A little help with bathing/dressing/bathroom;Assistance with cooking/housework;Assistance with feeding;Help with stairs or ramp for entrance;Assist for transportation   Can travel by private vehicle     No  Equipment Recommendations  Other (comment) (TBD at next venue of care)    Recommendations for Other Services       Precautions / Restrictions Precautions Precautions: Fall Restrictions Weight Bearing Restrictions: No     Mobility  Bed Mobility Overal bed mobility: Needs Assistance Bed Mobility: Supine to Sit, Sit to Supine     Supine to sit: Min assist Sit to supine: Min assist   General bed mobility comments: Min A for trunk and LLE control    Transfers                   General transfer comment: Pt declined secondary to awaiting dressing change to LLE    Ambulation/Gait                   Stairs             Wheelchair Mobility     Tilt Bed    Modified Rankin  (Stroke Patients Only)       Balance Overall balance assessment: Needs assistance   Sitting balance-Leahy Scale: Good                                      Cognition Arousal: Alert Behavior During Therapy: WFL for tasks assessed/performed Overall Cognitive Status: Within Functional Limits for tasks assessed                                          Exercises Total Joint Exercises Ankle Circles/Pumps: AROM, Strengthening, Both, 5 reps, 10 reps (Gentle manual resistance on the RLE only) Quad Sets: Strengthening, Both, 5 reps, 10 reps Gluteal Sets: Strengthening, Both, 5 reps, 10 reps Short Arc Quad: Strengthening, Both, 10 reps, 5 reps (Gentle manual resistance on the RLE only) Hip ABduction/ADduction: AAROM, Strengthening, Both, 10 reps, 5 reps Straight Leg Raises: AAROM, Strengthening, Both, 5 reps, 10 reps Long Arc Quad: AROM, Strengthening, Right, 10 reps, 5 reps Other Exercises Other Exercises: HEP education for BLE APs, QS, and GS x 10 each every 1-2 hours daily    General Comments        Pertinent Vitals/Pain Pain Assessment Pain Assessment: 0-10 Pain Score:  4  Pain Location: L LE Pain Descriptors / Indicators: Sore, Guarding Pain Intervention(s): Repositioned, Premedicated before session, Monitored during session    Home Living                          Prior Function            PT Goals (current goals can now be found in the care plan section) Progress towards PT goals: PT to reassess next treatment    Frequency    Min 1X/week      PT Plan      Co-evaluation              AM-PAC PT "6 Clicks" Mobility   Outcome Measure  Help needed turning from your back to your side while in a flat bed without using bedrails?: A Little Help needed moving from lying on your back to sitting on the side of a flat bed without using bedrails?: A Little Help needed moving to and from a bed to a chair (including a  wheelchair)?: A Little Help needed standing up from a chair using your arms (e.g., wheelchair or bedside chair)?: A Little Help needed to walk in hospital room?: A Little Help needed climbing 3-5 steps with a railing? : A Lot 6 Click Score: 17    End of Session Equipment Utilized During Treatment: Gait belt Activity Tolerance: Patient tolerated treatment well Patient left: with call bell/phone within reach;in bed;with bed alarm set Nurse Communication: Mobility status PT Visit Diagnosis: Pain;Unsteadiness on feet (R26.81);Muscle weakness (generalized) (M62.81);Other abnormalities of gait and mobility (R26.89);Difficulty in walking, not elsewhere classified (R26.2) Pain - Right/Left: Left Pain - part of body: Ankle and joints of foot;Leg     Time: 4034-7425 PT Time Calculation (min) (ACUTE ONLY): 28 min  Charges:    $Therapeutic Exercise: 23-37 mins PT General Charges $$ ACUTE PT VISIT: 1 Visit                     D. Elly Modena PT, DPT 01/03/23, 4:59 PM

## 2023-01-03 NOTE — Progress Notes (Signed)
PROGRESS NOTE    Eric Forbes  WUJ:[id number] DOB: 28-Aug-1955 DOA: 12/27/2022 PCP: [facility]   Assessment & Plan:   Principal Problem:   Lower extremity cellulitis Active Problems:   A-fib (HCC)   Type 2 diabetes mellitus (HCC)   Essential hypertension   Cellulitis of left lower extremity  Assessment and Plan: LLE cellulitis: completed abx course. Continue w/ wound care  Lower extremity edema: continue w/ leg elevation   HTN: continue on sotalol, lisinopril. Holding amlodipine     DM2: fair control, HbA1c 7.2. Continue glargine, SSI w/ accuchecks    Likely PAF: s/p left atrial appendage closure device insertion done in May 2024.High risk for bleeding as pt was on aspirin, plavix & eliquis. Eliquis was d/c as per Dr. Lucianne Muss.  Will need to follow-up with cardiologist as an outpatient for further recommendation of DOAC   Depression: severity unknown. Continue on buspar, duloxetine, trazodone   BPH: continue on flomax    Generalized weakness: PT/OT recs SNF. Waiting on insurance auth as per CM     Vitamin B12 deficiency: continue on B12 supplement   Obesity: BMI 36.6. Would benefit from weight loss     DVT prophylaxis: lovenox Code Status:  full  Family Communication:  Disposition Plan: likely d/c to SNF. Waiting on insurance auth  Status is: Inpatient Remains inpatient appropriate because: waiting on insurance auth. Medically stable     Level of care: Med-Surg Consultants:    Procedures:   Antimicrobials:   Subjective: Pt c/o foot pain   Objective: Vitals:   01/02/23 0752 01/02/23 1450 01/02/23 1946 01/03/23 0143  BP: (!) 152/97 138/87 136/89 136/85  Pulse:  95 100 73  Resp:  18 20 20   Temp: 98.2 F (36.8 C) 98.2 F (36.8 C) 98.2 F (36.8 C) 97.9 F (36.6 C)  TempSrc: Oral  Oral Oral  SpO2: 98% 95% 97% 98%  Weight:      Height:        Intake/Output Summary (Last 24 hours) at 01/03/2023 0825 Last data filed at 01/03/2023 0542 Gross  per 24 hour  Intake 120 ml  Output 1200 ml  Net -1080 ml   Filed Weights   12/27/22 0931  Weight: 115.7 kg    Examination:  General exam: appears calm & comfortable  Respiratory system: decreased breath sounds b/l  Cardiovascular system: S1 & S2+. No clicks or rubs  Gastrointestinal system: abd is soft, NT, obese & normal bowel sounds  Central nervous system: alert & awake. Moves all extremities   Psychiatry: Judgement and insight appears at baseline. Flat mood and affect    Data Reviewed: I have personally reviewed following labs and imaging studies  CBC: Recent Labs  Lab 12/29/22 0452 12/30/22 0528 12/31/22 0620 01/01/23 0555 01/02/23 0620  WBC 8.9 8.2 9.4 7.4 7.1  HGB 12.6* 12.4* 12.9* 12.1* 12.2*  HCT 36.5* 34.5* 35.7* 34.3* 34.2*  MCV 70.1* 68.6* 68.4* 69.3* 67.6*  PLT 145* 143* 158 145* 134*   Basic Metabolic Panel: Recent Labs  Lab 12/28/22 0406 12/28/22 0440 12/29/22 0452 12/30/22 0528 12/31/22 0620 01/01/23 0555 01/02/23 0620 01/03/23 0613  NA  --    < > 136 137 134* 136 139 139  K  --    < > 4.5 4.2 5.0 4.0 4.1 4.3  CL  --    < > 100 99 99 102 102 99  CO2  --    < > 29 33* 30 31 29 26   GLUCOSE  --    < >  253* 234* 156* 162* 159* 127*  BUN  --    < > 25* 22 22 27* 20 23  CREATININE  --    < > 0.97 0.97 1.11 0.97 0.85 0.93  CALCIUM  --    < > 9.1 9.2 9.0 8.8* 9.1 9.2  MG 2.0  --  2.1 2.0 2.2  --   --  2.2  PHOS 5.5*  --  4.1 3.6 4.9*  --   --  3.8   < > = values in this interval not displayed.   GFR: Estimated Creatinine Clearance: 96.9 mL/min (by C-G formula based on SCr of 0.93 mg/dL). Liver Function Tests: No results for input(s): "AST", "ALT", "ALKPHOS", "BILITOT", "PROT", "ALBUMIN" in the last 168 hours. No results for input(s): "LIPASE", "AMYLASE" in the last 168 hours. No results for input(s): "AMMONIA" in the last 168 hours. Coagulation Profile: No results for input(s): "INR", "PROTIME" in the last 168 hours. Cardiac Enzymes: No  results for input(s): "CKTOTAL", "CKMB", "CKMBINDEX", "TROPONINI" in the last 168 hours. BNP (last 3 results) No results for input(s): "PROBNP" in the last 8760 hours. HbA1C: No results for input(s): "HGBA1C" in the last 72 hours. CBG: Recent Labs  Lab 01/01/23 2047 01/02/23 0806 01/02/23 1151 01/02/23 1624 01/02/23 2137  GLUCAP 170* 147* 227* 172* 200*   Lipid Profile: No results for input(s): "CHOL", "HDL", "LDLCALC", "TRIG", "CHOLHDL", "LDLDIRECT" in the last 72 hours. Thyroid Function Tests: No results for input(s): "TSH", "T4TOTAL", "FREET4", "T3FREE", "THYROIDAB" in the last 72 hours. Anemia Panel: No results for input(s): "VITAMINB12", "FOLATE", "FERRITIN", "TIBC", "IRON", "RETICCTPCT" in the last 72 hours. Sepsis Labs: Recent Labs  Lab 12/27/22 0933  LATICACIDVEN 1.2    Recent Results (from the past 240 hour(s))  Blood culture (routine x 2)     Status: None   Collection Time: 12/27/22 12:05 PM   Specimen: BLOOD  Result Value Ref Range Status   Specimen Description BLOOD RIGHT ANTECUBITAL  Final   Special Requests   Final    BOTTLES DRAWN AEROBIC AND ANAEROBIC Blood Culture adequate volume   Culture   Final    NO GROWTH 5 DAYS Performed at [facility], [address] [place], [place] [address]    Report Status 01/01/2023 FINAL  Final  Blood culture (routine x 2)     Status: None   Collection Time: 12/27/22 12:05 PM   Specimen: BLOOD  Result Value Ref Range Status   Specimen Description BLOOD BLOOD LEFT FOREARM  Final   Special Requests   Final    BOTTLES DRAWN AEROBIC AND ANAEROBIC Blood Culture adequate volume   Culture   Final    NO GROWTH 5 DAYS Performed at [facility], [address], [place], [place] [address]    Report Status 01/01/2023 FINAL  Final         Radiology Studies: No results found.      Scheduled Meds:  acetaminophen  500 mg Oral TID   vitamin C  500 mg Oral Daily   aspirin EC  81 mg Oral Daily    busPIRone  10 mg Oral BID   clopidogrel  75 mg Oral Daily   vitamin B-12  500 mcg Oral Daily   doxycycline  100 mg Oral Q12H   DULoxetine  90 mg Oral Daily   enoxaparin (LOVENOX) injection  55 mg Subcutaneous Q24H   insulin aspart  0-15 Units Subcutaneous TID WC   insulin aspart  0-5 Units Subcutaneous QHS  insulin aspart  8 Units Subcutaneous TID WC   insulin glargine-yfgn  45 Units Subcutaneous QHS   lisinopril  20 mg Oral Daily   melatonin  5 mg Oral QHS   polyvinyl alcohol  2 drop Both Eyes BID   pregabalin  50 mg Oral TID   rosuvastatin  10 mg Oral QHS   sodium chloride flush  3 mL Intravenous Q12H   sotalol  80 mg Oral Q12H   tamsulosin  0.4 mg Oral QPC supper   traZODone  25 mg Oral QHS   Vitamin D (Ergocalciferol)  50,000 Units Oral Q7 days   Continuous Infusions:  sodium chloride 10 mL/hr at 12/28/22 1211     LOS: 6 days    Time spent: 25 mins     Charise Killian, MD [facility] Pager [phone]  If 7PM-7AM, please contact night-coverage [email or web address] 01/03/2023, 8:25 AM

## 2023-01-03 NOTE — TOC Progression Note (Signed)
Transition of Care [facility]) - Progression Note    Patient Details  Name: Eric Forbes MRN: [id number] Date of Birth: 18-Nov-1955  Transition of Care [facility]) CM/SW Contact  [name], RN Phone Number: 01/03/2023, 9:32 AM  Clinical Narrative:     Additional clinical sent to [facility] for auth.  Auth pending        Expected Discharge Plan and Services         Expected Discharge Date: 12/31/22                                     Social Determinants of Health (SDOH) Interventions SDOH Screenings   Food Insecurity: No Food Insecurity (12/27/2022)  Housing: Low Risk  (12/27/2022)  Transportation Needs: No Transportation Needs (12/27/2022)  Utilities: Not At Risk (12/27/2022)  Tobacco Use: High Risk (12/27/2022)    Readmission Risk Interventions     No data to display

## 2023-01-03 NOTE — TOC Progression Note (Signed)
Transition of Care [facility]) - Progression Note    Patient Details  Name: Eric Forbes MRN: [id number] Date of Birth: November 05, 1955  Transition of Care [facility]) CM/SW Contact  [name], RN Phone Number: 01/03/2023, 3:27 PM  Clinical Narrative:      Sherron Monday with Diannia Ruder at [facility].  She inquired why patient was unable to return to ALF.  I notified her that per the ALF they are not able to meet the patients needs in his current condition.  She states that [facility] is pending MD review       Expected Discharge Plan and Services         Expected Discharge Date: 12/31/22                                     Social Determinants of Health (SDOH) Interventions SDOH Screenings   Food Insecurity: No Food Insecurity (12/27/2022)  Housing: Low Risk  (12/27/2022)  Transportation Needs: No Transportation Needs (12/27/2022)  Utilities: Not At Risk (12/27/2022)  Tobacco Use: High Risk (12/27/2022)    Readmission Risk Interventions     No data to display

## 2023-01-04 DIAGNOSIS — L03116 Cellulitis of left lower limb: Secondary | ICD-10-CM | POA: Diagnosis not present

## 2023-01-04 LAB — CBC
HCT: 32.8 % — ABNORMAL LOW (ref 39.0–52.0)
Hemoglobin: 11.8 g/dL — ABNORMAL LOW (ref 13.0–17.0)
MCH: 24.1 pg — ABNORMAL LOW (ref 26.0–34.0)
MCHC: 36 g/dL (ref 30.0–36.0)
MCV: 66.9 fL — ABNORMAL LOW (ref 80.0–100.0)
Platelets: 141 10*3/uL — ABNORMAL LOW (ref 150–400)
RBC: 4.9 MIL/uL (ref 4.22–5.81)
RDW: 15.7 % — ABNORMAL HIGH (ref 11.5–15.5)
WBC: 7.2 10*3/uL (ref 4.0–10.5)
nRBC: 0 % (ref 0.0–0.2)

## 2023-01-04 LAB — GLUCOSE, CAPILLARY
Glucose-Capillary: 154 mg/dL — ABNORMAL HIGH (ref 70–99)
Glucose-Capillary: 177 mg/dL — ABNORMAL HIGH (ref 70–99)
Glucose-Capillary: 212 mg/dL — ABNORMAL HIGH (ref 70–99)
Glucose-Capillary: 290 mg/dL — ABNORMAL HIGH (ref 70–99)

## 2023-01-04 LAB — PHOSPHORUS: Phosphorus: 4.3 mg/dL (ref 2.5–4.6)

## 2023-01-04 LAB — MAGNESIUM: Magnesium: 2 mg/dL (ref 1.7–2.4)

## 2023-01-04 LAB — BASIC METABOLIC PANEL
Anion gap: 5 (ref 5–15)
BUN: 20 mg/dL (ref 8–23)
CO2: 30 mmol/L (ref 22–32)
Calcium: 8.9 mg/dL (ref 8.9–10.3)
Chloride: 102 mmol/L (ref 98–111)
Creatinine, Ser: 0.85 mg/dL (ref 0.61–1.24)
GFR, Estimated: >60 mL/min (ref >60)
Glucose, Bld: 236 mg/dL — ABNORMAL HIGH (ref 70–99)
Potassium: 4 mmol/L (ref 3.5–5.1)
Sodium: 137 mmol/L (ref 135–145)

## 2023-01-04 MED ORDER — HYDROCHLOROTHIAZIDE 12.5 MG PO TABS
12.5000 mg | ORAL_TABLET | Freq: Every day | ORAL | Status: DC
  Administered 2023-01-05 – 2023-01-09 (×5): 12.5 mg via ORAL
  Filled 2023-01-04 (×5): qty 1

## 2023-01-04 MED ORDER — LABETALOL HCL 5 MG/ML IV SOLN
10.0000 mg | Freq: Once | INTRAVENOUS | Status: AC
  Administered 2023-01-04: 10 mg via INTRAVENOUS

## 2023-01-04 NOTE — Progress Notes (Signed)
PROGRESS NOTE    Eric Forbes  ZOX:[id number] DOB: [birth year] DOA: 12/27/2022 PCP: [facility]   Assessment & Plan:   Principal Problem:   Lower extremity cellulitis Active Problems:   A-fib (HCC)   Type 2 diabetes mellitus (HCC)   Essential hypertension   Cellulitis of left lower extremity  Assessment and Plan: LLE cellulitis: completed abx course. Continue w/ wound care   Lower extremity edema: continue w/ leg elevation   HTN: continue on lisinopril, sotalol. Holding amlodipine   DM2: fair control, HbA1c 7.2. Continue on glargine, SSI w/ accuchecks    Likely PAF: s/p left atrial appendage closure device insertion done in May 2024.High risk for bleeding as pt was on aspirin, plavix & eliquis. Eliquis was d/c as per Dr. Lucianne Muss.  Will need to follow-up with cardiologist as an outpatient for further recommendation of DOAC   Depression: severity unknown. Continue on duloxetine, trazodone, buspar   BPH: continue on flomax    Generalized weakness: PT/OT recs SNF. Waiting on insurance auth still as per CM    Vitamin B12 deficiency: continue on B12 supplement    Obesity: BMI 36.6. Would benefit from weight loss     DVT prophylaxis: lovenox Code Status:  full  Family Communication: discussed pt's care w/ pt's brother and answered his questions Disposition Plan: likely d/c to SNF. Waiting on insurance auth  Status is: Inpatient Remains inpatient appropriate because: still waiting on insurance auth. Medically stable     Level of care: Med-Surg Consultants:    Procedures:   Antimicrobials:   Subjective: Pt c/o leg spasm   Objective: Vitals:   01/03/23 0913 01/03/23 1644 01/03/23 2050 01/04/23 0442  BP: (!) 149/102 138/79 (!) 164/103 (!) 165/94  Pulse: 74 76 94 75  Resp:  18 20 20   Temp: 98.2 F (36.8 C) 98.4 F (36.9 C) 98.3 F (36.8 C) 98 F (36.7 C)  TempSrc: Oral Oral Oral Oral  SpO2: 99% 99% 100% 96%  Weight:      Height:         Intake/Output Summary (Last 24 hours) at 01/04/2023 0820 Last data filed at 01/04/2023 0500 Gross per 24 hour  Intake --  Output 800 ml  Net -800 ml   Filed Weights   12/27/22 0931  Weight: 115.7 kg    Examination:  General exam: appears comfortable  Respiratory system: diminished breath sounds b/l  Cardiovascular system: S1/S2+. No rubs or clicks  Gastrointestinal system: abd is soft, NT, ND & normal bowel sounds  Central nervous system: alert & awake. Moves all extremities   Psychiatry: Judgement and insight appears at baseline. Flat mood and affect     Data Reviewed: I have personally reviewed following labs and imaging studies  CBC: Recent Labs  Lab 12/30/22 0528 12/31/22 0620 01/01/23 0555 01/02/23 0620 01/04/23 0410  WBC 8.2 9.4 7.4 7.1 7.2  HGB 12.4* 12.9* 12.1* 12.2* 11.8*  HCT 34.5* 35.7* 34.3* 34.2* 32.8*  MCV 68.6* 68.4* 69.3* 67.6* 66.9*  PLT 143* 158 145* 134* 141*   Basic Metabolic Panel: Recent Labs  Lab 12/29/22 0452 12/30/22 0528 12/31/22 0620 01/01/23 0555 01/02/23 0620 01/03/23 0613 01/04/23 0410  NA 136 137 134* 136 139 139 137  K 4.5 4.2 5.0 4.0 4.1 4.3 4.0  CL 100 99 99 102 102 99 102  CO2 29 33* 30 31 29 26 30   GLUCOSE 253* 234* 156* 162* 159* 127* 236*  BUN 25* 22 22 27* 20 23 20   CREATININE 0.97  0.97 1.11 0.97 0.85 0.93 0.85  CALCIUM 9.1 9.2 9.0 8.8* 9.1 9.2 8.9  MG 2.1 2.0 2.2  --   --  2.2 2.0  PHOS 4.1 3.6 4.9*  --   --  3.8 4.3   GFR: Estimated Creatinine Clearance: 106 mL/min (by C-G formula based on SCr of 0.85 mg/dL). Liver Function Tests: No results for input(s): "AST", "ALT", "ALKPHOS", "BILITOT", "PROT", "ALBUMIN" in the last 168 hours. No results for input(s): "LIPASE", "AMYLASE" in the last 168 hours. No results for input(s): "AMMONIA" in the last 168 hours. Coagulation Profile: No results for input(s): "INR", "PROTIME" in the last 168 hours. Cardiac Enzymes: No results for input(s): "CKTOTAL", "CKMB",  "CKMBINDEX", "TROPONINI" in the last 168 hours. BNP (last 3 results) No results for input(s): "PROBNP" in the last 8760 hours. HbA1C: No results for input(s): "HGBA1C" in the last 72 hours. CBG: Recent Labs  Lab 01/03/23 0906 01/03/23 1202 01/03/23 1640 01/03/23 2048 01/04/23 0817  GLUCAP 132* 228* 193* 164* 154*   Lipid Profile: No results for input(s): "CHOL", "HDL", "LDLCALC", "TRIG", "CHOLHDL", "LDLDIRECT" in the last 72 hours. Thyroid Function Tests: No results for input(s): "TSH", "T4TOTAL", "FREET4", "T3FREE", "THYROIDAB" in the last 72 hours. Anemia Panel: No results for input(s): "VITAMINB12", "FOLATE", "FERRITIN", "TIBC", "IRON", "RETICCTPCT" in the last 72 hours. Sepsis Labs: No results for input(s): "PROCALCITON", "LATICACIDVEN" in the last 168 hours.   Recent Results (from the past 240 hour(s))  Blood culture (routine x 2)     Status: None   Collection Time: 12/27/22 12:05 PM   Specimen: BLOOD  Result Value Ref Range Status   Specimen Description BLOOD RIGHT ANTECUBITAL  Final   Special Requests   Final    BOTTLES DRAWN AEROBIC AND ANAEROBIC Blood Culture adequate volume   Culture   Final    NO GROWTH 5 DAYS Performed at [facility], [address], [place], [place] [address]    Report Status 01/01/2023 FINAL  Final  Blood culture (routine x 2)     Status: None   Collection Time: 12/27/22 12:05 PM   Specimen: BLOOD  Result Value Ref Range Status   Specimen Description BLOOD BLOOD LEFT FOREARM  Final   Special Requests   Final    BOTTLES DRAWN AEROBIC AND ANAEROBIC Blood Culture adequate volume   Culture   Final    NO GROWTH 5 DAYS Performed at [facility], [address], [place], [place] [address]    Report Status 01/01/2023 FINAL  Final         Radiology Studies: No results found.      Scheduled Meds:  acetaminophen  500 mg Oral TID   vitamin C  500 mg Oral Daily   aspirin EC  81 mg Oral Daily   busPIRone  10 mg  Oral BID   clopidogrel  75 mg Oral Daily   vitamin B-12  500 mcg Oral Daily   DULoxetine  90 mg Oral Daily   enoxaparin (LOVENOX) injection  55 mg Subcutaneous Q24H   insulin aspart  0-15 Units Subcutaneous TID WC   insulin aspart  0-5 Units Subcutaneous QHS   insulin aspart  8 Units Subcutaneous TID WC   insulin glargine-yfgn  45 Units Subcutaneous QHS   lisinopril  20 mg Oral Daily   melatonin  5 mg Oral QHS   polyvinyl alcohol  2 drop Both Eyes BID   pregabalin  50 mg Oral TID   rosuvastatin  10 mg Oral QHS  sodium chloride flush  3 mL Intravenous Q12H   sotalol  80 mg Oral Q12H   tamsulosin  0.4 mg Oral QPC supper   traZODone  25 mg Oral QHS   Vitamin D (Ergocalciferol)  50,000 Units Oral Q7 days   Continuous Infusions:  sodium chloride 10 mL/hr at 12/28/22 1211     LOS: 7 days    Time spent: 25 mins     Charise Killian, MD [facility] Pager [phone]  If 7PM-7AM, please contact night-coverage [email or web address] 01/04/2023, 8:20 AM

## 2023-01-04 NOTE — Progress Notes (Signed)
Patient has exhibited inappropriate behaviors with male staff reported to appropriate leadership.

## 2023-01-04 NOTE — Plan of Care (Signed)

## 2023-01-04 NOTE — Progress Notes (Signed)
       CROSS COVER NOTE  NAME: Donahue Mondor MRN: [id number] DOB : Jan 05, 1956    Concern as stated by nurse / staff    Patient admit for LLE cellulitis, HX of a.fib on Eliquis, CVA, type 2 diabetes, hypertension, CAD. BP is 163/107, HR 97. Rechecked 3 times. Looks like he has been about this hypertensive since 8pm last night. Requesting PRN for HTN. On Lisinopril 20mg  daily, sotalol 80mg  every 12 hours.     Pertinent findings on chart review: Outpatient antihypertensive regimen sotalol amlodipine and lisinopril Amlodipine held on admission secondary to LE edema    Assessment and  Interventions   Assessment:    01/04/2023    7:11 PM 01/04/2023    4:43 PM 01/04/2023    8:41 AM  Vitals with BMI  Systolic 163 181 528  Diastolic 107 98 105  Pulse 97 81    High ASCVD risk: diabetes, obesity, HTN BP goal 125-130/80  BP not at goal  Pain controlled Plan: Labetalol 10 mg x1 Add hydrochlorohiazide - start at 12.5 daily, increase as appropriate; monitor renal function        Donnie Mesa NP [facility] Cross Cover 7pm-7am - check amion for availability Pager [phone]

## 2023-01-04 NOTE — TOC Progression Note (Signed)
Transition of Care [facility]) - Progression Note    Patient Details  Name: Eric Forbes MRN: [id number] Date of Birth: 07/24/1955  Transition of Care [facility]) CM/SW Contact  Kemper Durie, RN Phone Number: 01/04/2023, 2:55 PM  Clinical Narrative:     [facility] pending       Expected Discharge Plan and Services         Expected Discharge Date: 12/31/22                                     Social Determinants of Health (SDOH) Interventions SDOH Screenings   Food Insecurity: No Food Insecurity (12/27/2022)  Housing: Low Risk  (12/27/2022)  Transportation Needs: No Transportation Needs (12/27/2022)  Utilities: Not At Risk (12/27/2022)  Tobacco Use: High Risk (12/27/2022)    Readmission Risk Interventions     No data to display

## 2023-01-05 DIAGNOSIS — L03116 Cellulitis of left lower limb: Secondary | ICD-10-CM | POA: Diagnosis not present

## 2023-01-05 LAB — CBC
HCT: 33.8 % — ABNORMAL LOW (ref 39.0–52.0)
Hemoglobin: 12 g/dL — ABNORMAL LOW (ref 13.0–17.0)
MCH: 24.5 pg — ABNORMAL LOW (ref 26.0–34.0)
MCHC: 35.5 g/dL (ref 30.0–36.0)
MCV: 69.1 fL — ABNORMAL LOW (ref 80.0–100.0)
Platelets: 144 10*3/uL — ABNORMAL LOW (ref 150–400)
RBC: 4.89 MIL/uL (ref 4.22–5.81)
RDW: 15.8 % — ABNORMAL HIGH (ref 11.5–15.5)
WBC: 8.5 10*3/uL (ref 4.0–10.5)
nRBC: 0 % (ref 0.0–0.2)

## 2023-01-05 LAB — BASIC METABOLIC PANEL
Anion gap: 6 (ref 5–15)
BUN: 21 mg/dL (ref 8–23)
CO2: 28 mmol/L (ref 22–32)
Calcium: 8.9 mg/dL (ref 8.9–10.3)
Chloride: 102 mmol/L (ref 98–111)
Creatinine, Ser: 0.83 mg/dL (ref 0.61–1.24)
GFR, Estimated: >60 mL/min (ref >60)
Glucose, Bld: 107 mg/dL — ABNORMAL HIGH (ref 70–99)
Potassium: 3.9 mmol/L (ref 3.5–5.1)
Sodium: 136 mmol/L (ref 135–145)

## 2023-01-05 LAB — GLUCOSE, CAPILLARY
Glucose-Capillary: 128 mg/dL — ABNORMAL HIGH (ref 70–99)
Glucose-Capillary: 171 mg/dL — ABNORMAL HIGH (ref 70–99)
Glucose-Capillary: 128 mg/dL — ABNORMAL HIGH (ref 70–99)
Glucose-Capillary: 126 mg/dL — ABNORMAL HIGH (ref 70–99)

## 2023-01-05 LAB — PHOSPHORUS: Phosphorus: 4.8 mg/dL — ABNORMAL HIGH (ref 2.5–4.6)

## 2023-01-05 LAB — MAGNESIUM: Magnesium: 2 mg/dL (ref 1.7–2.4)

## 2023-01-05 MED ORDER — LACTULOSE 10 GM/15ML PO SOLN
20.0000 g | Freq: Two times a day (BID) | ORAL | Status: DC | PRN
  Administered 2023-01-06 – 2023-01-12 (×2): 20 g via ORAL
  Filled 2023-01-05 (×2): qty 30

## 2023-01-05 NOTE — TOC Progression Note (Signed)
Transition of Care [facility]) - Progression Note    Patient Details  Name: Quason Lauritsen MRN: [id number] Date of Birth: 1955-08-09  Transition of Care [facility]) CM/SW Contact  Kemper Durie, RN Phone Number: 01/05/2023, 11:18 AM  Clinical Narrative:     [facility] remains pending.       Expected Discharge Plan and Services         Expected Discharge Date: 12/31/22                                     Social Determinants of Health (SDOH) Interventions SDOH Screenings   Food Insecurity: No Food Insecurity (12/27/2022)  Housing: Low Risk  (12/27/2022)  Transportation Needs: No Transportation Needs (12/27/2022)  Utilities: Not At Risk (12/27/2022)  Tobacco Use: High Risk (12/27/2022)    Readmission Risk Interventions     No data to display

## 2023-01-05 NOTE — Progress Notes (Signed)
PROGRESS NOTE    Eric Forbes  ZOX:[id number] DOB: 04-30-1956 DOA: 12/27/2022 PCP: [facility]   Assessment & Plan:   Principal Problem:   Lower extremity cellulitis Active Problems:   A-fib (HCC)   Type 2 diabetes mellitus (HCC)   Essential hypertension   Cellulitis of left lower extremity  Assessment and Plan: LLE cellulitis: completed abx course. Continue w/ wound care  Lower extremity edema: continue w/ leg elevation   HTN: continue on sotalol, lisinopril & HCTZ. Holding amlodipine   DM2: fair control, HbA1c 7.2. Continue on glargine, SSI w/ accuchecks    Likely PAF: s/p left atrial appendage closure device insertion done in May 2024.High risk for bleeding as pt was on aspirin, plavix & eliquis. Eliquis was d/c as per Dr. Lucianne Muss.  Will need to follow-up with cardiologist as an outpatient for further recommendation of DOAC   Depression: severity unknown. Continue on buspar, duloxetine, trazodone   BPH: continue on flomax    Generalized weakness: PT/OT recs SNF. Still waiting on insurance auth as per CM  Vitamin B12 deficiency: continue on B12 supplement   Obesity: BMI 36.6. Would benefit from weight loss     DVT prophylaxis: lovenox Code Status:  full  Family Communication: discussed pt's care w/ pt's brother and answered his questions Disposition Plan: likely d/c to SNF. Still waiting on insurance auth  Status is: Inpatient Remains inpatient appropriate because: medically stable. Waiting on insurance auth as per CM      Level of care: Med-Surg Consultants:    Procedures:   Antimicrobials:   Subjective: Pt c/o constipation  Objective: Vitals:   01/04/23 2008 01/04/23 2059 01/04/23 2143 01/05/23 0056  BP: (!) 169/99 (!) 177/109 (!) 163/94 114/66  Pulse: 98 84 86 71  Resp:    20  Temp:    (!) 97.5 F (36.4 C)  TempSrc:    Oral  SpO2:    98%  Weight:      Height:        Intake/Output Summary (Last 24 hours) at 01/05/2023 8119 Last data  filed at 01/05/2023 0600 Gross per 24 hour  Intake 240 ml  Output 1200 ml  Net -960 ml   Filed Weights   12/27/22 0931  Weight: 115.7 kg    Examination:  General exam: Appears calm & comfortable  Respiratory system: decreased breath sounds b/l Cardiovascular system: S1/S2+. No rubs or gallops Gastrointestinal system: abd is soft, NT, obese & hypoactive bowel sounds  Central nervous system: alert and awake. Moves all extremities  Psychiatry: Judgement and insight appears at baseline. Appropriate mood and affect     Data Reviewed: I have personally reviewed following labs and imaging studies  CBC: Recent Labs  Lab 12/31/22 0620 01/01/23 0555 01/02/23 0620 01/04/23 0410 01/05/23 0412  WBC 9.4 7.4 7.1 7.2 8.5  HGB 12.9* 12.1* 12.2* 11.8* 12.0*  HCT 35.7* 34.3* 34.2* 32.8* 33.8*  MCV 68.4* 69.3* 67.6* 66.9* 69.1*  PLT 158 145* 134* 141* 144*   Basic Metabolic Panel: Recent Labs  Lab 12/30/22 0528 12/31/22 0620 01/01/23 0555 01/02/23 0620 01/03/23 0613 01/04/23 0410 01/05/23 0412  NA 137 134* 136 139 139 137 136  K 4.2 5.0 4.0 4.1 4.3 4.0 3.9  CL 99 99 102 102 99 102 102  CO2 33* 30 31 29 26 30 28   GLUCOSE 234* 156* 162* 159* 127* 236* 107*  BUN 22 22 27* 20 23 20 21   CREATININE 0.97 1.11 0.97 0.85 0.93 0.85 0.83  CALCIUM  9.2 9.0 8.8* 9.1 9.2 8.9 8.9  MG 2.0 2.2  --   --  2.2 2.0 2.0  PHOS 3.6 4.9*  --   --  3.8 4.3 4.8*   GFR: Estimated Creatinine Clearance: 108.6 mL/min (by C-G formula based on SCr of 0.83 mg/dL). Liver Function Tests: No results for input(s): "AST", "ALT", "ALKPHOS", "BILITOT", "PROT", "ALBUMIN" in the last 168 hours. No results for input(s): "LIPASE", "AMYLASE" in the last 168 hours. No results for input(s): "AMMONIA" in the last 168 hours. Coagulation Profile: No results for input(s): "INR", "PROTIME" in the last 168 hours. Cardiac Enzymes: No results for input(s): "CKTOTAL", "CKMB", "CKMBINDEX", "TROPONINI" in the last 168  hours. BNP (last 3 results) No results for input(s): "PROBNP" in the last 8760 hours. HbA1C: No results for input(s): "HGBA1C" in the last 72 hours. CBG: Recent Labs  Lab 01/04/23 0817 01/04/23 1123 01/04/23 1643 01/04/23 2053 01/05/23 0745  GLUCAP 154* 177* 212* 290* 128*   Lipid Profile: No results for input(s): "CHOL", "HDL", "LDLCALC", "TRIG", "CHOLHDL", "LDLDIRECT" in the last 72 hours. Thyroid Function Tests: No results for input(s): "TSH", "T4TOTAL", "FREET4", "T3FREE", "THYROIDAB" in the last 72 hours. Anemia Panel: No results for input(s): "VITAMINB12", "FOLATE", "FERRITIN", "TIBC", "IRON", "RETICCTPCT" in the last 72 hours. Sepsis Labs: No results for input(s): "PROCALCITON", "LATICACIDVEN" in the last 168 hours.   Recent Results (from the past 240 hour(s))  Blood culture (routine x 2)     Status: None   Collection Time: 12/27/22 12:05 PM   Specimen: BLOOD  Result Value Ref Range Status   Specimen Description BLOOD RIGHT ANTECUBITAL  Final   Special Requests   Final    BOTTLES DRAWN AEROBIC AND ANAEROBIC Blood Culture adequate volume   Culture   Final    NO GROWTH 5 DAYS Performed at [facility], [address], [place], [place] [address]    Report Status 01/01/2023 FINAL  Final  Blood culture (routine x 2)     Status: None   Collection Time: 12/27/22 12:05 PM   Specimen: BLOOD  Result Value Ref Range Status   Specimen Description BLOOD BLOOD LEFT FOREARM  Final   Special Requests   Final    BOTTLES DRAWN AEROBIC AND ANAEROBIC Blood Culture adequate volume   Culture   Final    NO GROWTH 5 DAYS Performed at [facility], [address], [place], [place] [address]    Report Status 01/01/2023 FINAL  Final         Radiology Studies: No results found.      Scheduled Meds:  acetaminophen  500 mg Oral TID   vitamin C  500 mg Oral Daily   aspirin EC  81 mg Oral Daily   busPIRone  10 mg Oral BID   clopidogrel  75 mg Oral Daily    vitamin B-12  500 mcg Oral Daily   DULoxetine  90 mg Oral Daily   enoxaparin (LOVENOX) injection  55 mg Subcutaneous Q24H   hydrochlorothiazide  12.5 mg Oral Daily   insulin aspart  0-15 Units Subcutaneous TID WC   insulin aspart  0-5 Units Subcutaneous QHS   insulin aspart  8 Units Subcutaneous TID WC   insulin glargine-yfgn  45 Units Subcutaneous QHS   lisinopril  20 mg Oral Daily   melatonin  5 mg Oral QHS   polyvinyl alcohol  2 drop Both Eyes BID   pregabalin  50 mg Oral TID   rosuvastatin  10 mg Oral QHS  sodium chloride flush  3 mL Intravenous Q12H   sotalol  80 mg Oral Q12H   tamsulosin  0.4 mg Oral QPC supper   traZODone  25 mg Oral QHS   Vitamin D (Ergocalciferol)  50,000 Units Oral Q7 days   Continuous Infusions:  sodium chloride 10 mL/hr at 12/28/22 1211     LOS: 8 days    Time spent: 25 mins     Charise Killian, MD [facility] Pager [phone]  If 7PM-7AM, please contact night-coverage [email or web address] 01/05/2023, 8:22 AM

## 2023-01-05 NOTE — Plan of Care (Signed)

## 2023-01-06 DIAGNOSIS — L03116 Cellulitis of left lower limb: Secondary | ICD-10-CM | POA: Diagnosis not present

## 2023-01-06 LAB — BASIC METABOLIC PANEL
Anion gap: 10 (ref 5–15)
BUN: 24 mg/dL — ABNORMAL HIGH (ref 8–23)
CO2: 28 mmol/L (ref 22–32)
Calcium: 9.3 mg/dL (ref 8.9–10.3)
Chloride: 102 mmol/L (ref 98–111)
Creatinine, Ser: 0.9 mg/dL (ref 0.61–1.24)
GFR, Estimated: >60 mL/min (ref >60)
Glucose, Bld: 59 mg/dL — ABNORMAL LOW (ref 70–99)
Potassium: 4.3 mmol/L (ref 3.5–5.1)
Sodium: 140 mmol/L (ref 135–145)

## 2023-01-06 LAB — CBC
HCT: 34.7 % — ABNORMAL LOW (ref 39.0–52.0)
Hemoglobin: 12 g/dL — ABNORMAL LOW (ref 13.0–17.0)
MCH: 24.4 pg — ABNORMAL LOW (ref 26.0–34.0)
MCHC: 34.6 g/dL (ref 30.0–36.0)
MCV: 70.7 fL — ABNORMAL LOW (ref 80.0–100.0)
Platelets: 130 10*3/uL — ABNORMAL LOW (ref 150–400)
RBC: 4.91 MIL/uL (ref 4.22–5.81)
RDW: 16 % — ABNORMAL HIGH (ref 11.5–15.5)
WBC: 7.4 10*3/uL (ref 4.0–10.5)
nRBC: 0 % (ref 0.0–0.2)

## 2023-01-06 LAB — GLUCOSE, CAPILLARY
Glucose-Capillary: 118 mg/dL — ABNORMAL HIGH (ref 70–99)
Glucose-Capillary: 175 mg/dL — ABNORMAL HIGH (ref 70–99)
Glucose-Capillary: 282 mg/dL — ABNORMAL HIGH (ref 70–99)
Glucose-Capillary: 281 mg/dL — ABNORMAL HIGH (ref 70–99)
Glucose-Capillary: 399 mg/dL — ABNORMAL HIGH (ref 70–99)

## 2023-01-06 LAB — MAGNESIUM: Magnesium: 2 mg/dL (ref 1.7–2.4)

## 2023-01-06 LAB — PHOSPHORUS: Phosphorus: 4.6 mg/dL (ref 2.5–4.6)

## 2023-01-06 NOTE — TOC Progression Note (Addendum)
Transition of Care [facility]) - Progression Note    Patient Details  Name: Eric Forbes MRN: [id number] Date of Birth: Apr 22, 1956  [facility]) CM/SW Contact  [name], RN Phone Number: 01/06/2023, 4:22 PM  Clinical Narrative:    Insurance Berkley Harvey was denied Peer to peer was denied  Patient defers me to provide this information to his brother Eric Forbes has filed a fast track appeal through [facility]  Reference number [id number]      Confirmed with Brynda Greathouse that [facility] that patient does have SNF benefits.  Brother request referral to be sent to see if patient would be approved through [place].  Referral sent via secure Email    Expected Discharge Plan and Services         Expected Discharge Date: 12/31/22                                     Social Determinants of Health (SDOH) Interventions SDOH Screenings   Food Insecurity: No Food Insecurity (12/27/2022)  Housing: Low Risk  (12/27/2022)  Transportation Needs: No Transportation Needs (12/27/2022)  Utilities: Not At Risk (12/27/2022)  Tobacco Use: High Risk (12/27/2022)    Readmission Risk Interventions     No data to display

## 2023-01-06 NOTE — Plan of Care (Signed)

## 2023-01-06 NOTE — Progress Notes (Signed)
Physical Therapy Treatment Patient Details Name: Eric Forbes MRN: [id number] DOB: [birth year] Today's Date: 01/06/2023   History of Present Illness Pt is a 67 y/o M admitted on 12/27/22 after presenting with c/o pain & redness in LLE. Pt is being treated for LLE cellulitis. PMH: a-fib on Eliquis, CVA, DM2, HTN, CAD, MI    PT Comments  Pt was pleasant and motivated to participate during the session and put forth good effort throughout. Pt required cuing for proper sequencing with transfers most notably for increased trunk flexion for improved control and decreased caregiver assistance.  Pt was generally steady in standing but did present with occasional min instability/buckling that the pt was able to self-correct.  Pt reported no adverse symptoms during the session other than LLE and low back pain that did not worsen with activity.  Pt will benefit from continued PT services upon discharge to safely address deficits listed in patient problem list for decreased caregiver assistance and eventual return to PLOF.      If plan is discharge home, recommend the following: A little help with walking and/or transfers;A little help with bathing/dressing/bathroom;Assistance with cooking/housework;Assistance with feeding;Help with stairs or ramp for entrance;Assist for transportation   Can travel by private vehicle     No  Equipment Recommendations  Other (comment) (TBD)    Recommendations for Other Services       Precautions / Restrictions Precautions Precautions: Fall Restrictions Weight Bearing Restrictions: No     Mobility  Bed Mobility               General bed mobility comments: NT, pt in recliner pre/post session    Transfers Overall transfer level: Needs assistance Equipment used: Rolling walker (2 wheels) Transfers: Sit to/from Stand Sit to Stand: Min assist, +2 physical assistance           General transfer comment: Mod verbal and tactile cues for increased trunk  flexion and R hand placement    Ambulation/Gait Ambulation/Gait assistance: Contact guard assist Gait Distance (Feet): 10 Feet x 3 Assistive device: Rolling walker (2 wheels) Gait Pattern/deviations: Step-to pattern, Shuffle, Decreased stance time - left, Decreased step length - right Gait velocity: decreased     General Gait Details: L then R step-to sequencing with occasional shuffling of the LLE and 1-2 instances of min instability that the pt was able to self-correct; LUE assisted onto RW due to weakness that is at baseline level per patient report   Stairs             Wheelchair Mobility     Tilt Bed    Modified Rankin (Stroke Patients Only)       Balance Overall balance assessment: Needs assistance Sitting-balance support: No upper extremity supported, Feet supported Sitting balance-Leahy Scale: Good     Standing balance support: Bilateral upper extremity supported Standing balance-Leahy Scale: Fair                              Cognition Arousal: Alert Behavior During Therapy: WFL for tasks assessed/performed Overall Cognitive Status: Within Functional Limits for tasks assessed                                          Exercises Other Exercises Other Exercises: Sit to stand x 5 with cues for hand and foot positioning and increased trunk flexion  with focus on improved eccentric control    General Comments        Pertinent Vitals/Pain Pain Assessment Pain Assessment: 0-10 Pain Score: 3  Pain Location: L LE and low back Pain Descriptors / Indicators: Sore Pain Intervention(s): Repositioned, Premedicated before session, Monitored during session    Home Living                          Prior Function            PT Goals (current goals can now be found in the care plan section) Progress towards PT goals: Progressing toward goals    Frequency    Min 1X/week      PT Plan      Co-evaluation               AM-PAC PT "6 Clicks" Mobility   Outcome Measure  Help needed turning from your back to your side while in a flat bed without using bedrails?: A Little Help needed moving from lying on your back to sitting on the side of a flat bed without using bedrails?: A Little Help needed moving to and from a bed to a chair (including a wheelchair)?: A Little Help needed standing up from a chair using your arms (e.g., wheelchair or bedside chair)?: A Little Help needed to walk in hospital room?: A Little Help needed climbing 3-5 steps with a railing? : A Lot 6 Click Score: 17    End of Session Equipment Utilized During Treatment: Gait belt Activity Tolerance: Patient tolerated treatment well Patient left: in chair;with call bell/phone within reach;with chair alarm set Nurse Communication: Mobility status PT Visit Diagnosis: Pain;Unsteadiness on feet (R26.81);Muscle weakness (generalized) (M62.81);Other abnormalities of gait and mobility (R26.89);Difficulty in walking, not elsewhere classified (R26.2) Pain - Right/Left: Left Pain - part of body: Ankle and joints of foot;Leg     Time: 1610-9604 PT Time Calculation (min) (ACUTE ONLY): 23 min  Charges:    $Gait Training: 8-22 mins $Therapeutic Activity: 8-22 mins PT General Charges $$ ACUTE PT VISIT: 1 Visit                     D. Scott Shalisa Mcquade PT, DPT 01/06/23, 4:31 PM

## 2023-01-06 NOTE — Inpatient Diabetes Management (Signed)
Inpatient Diabetes Program Recommendations  AACE/ADA: New Consensus Statement on Inpatient Glycemic Control (2015)  Target Ranges:  Prepandial:   less than 140 mg/dL      Peak postprandial:   less than 180 mg/dL (1-2 hours)      Critically ill patients:  140 - 180 mg/dL    Latest Reference Range & Units 01/05/23 07:45 01/05/23 11:59 01/05/23 17:26 01/05/23 21:38  Glucose-Capillary 70 - 99 mg/dL 347 (H) 425 (H) 956 (H) 126 (H)  (H): Data is abnormally high  Latest Reference Range & Units 01/06/23 07:26  Glucose-Capillary 70 - 99 mg/dL 387 (H)  (H): Data is abnormally high  Latest Reference Range & Units 01/06/23 04:39  Glucose 70 - 99 mg/dL 59 (L)  (L): Data is abnormally low    Home DM Meds: Novolog 28 units TID with meals        Semglee 70 units QHS  Current Orders: Novolog Moderate Correction Scale/ SSI (0-15 units) TID AC + HS     Novolog 8 units TID with meals     Semglee 45 units at bedtime   MD- Note CBG was OK this AM (118), however, Lab Glucose was down to 59 at 4:39am.  May consider reducing the Semglee insulin to 40 units at bedtime     --Will follow patient during hospitalization--  Ambrose Finland RN, MSN, CDCES Diabetes Coordinator Inpatient Glycemic Control Team Team Pager: [phone] (8a-5p)

## 2023-01-06 NOTE — Progress Notes (Signed)
PROGRESS NOTE    Eric Forbes  ZOX:[id number] DOB: 1955-07-22 DOA: 12/27/2022 PCP: [facility]   Assessment & Plan:   Principal Problem:   Lower extremity cellulitis Active Problems:   A-fib (HCC)   Type 2 diabetes mellitus (HCC)   Essential hypertension   Cellulitis of left lower extremity  Assessment and Plan: LLE cellulitis: completed abx course. Continue w/ wound care   Lower extremity edema: continue w/ leg elevation. Restart lasix   HTN: continue on lisinopril, HCTZ, sotalol. Holding amlodipine   DM2: fair control, HbA1c 7.2. Continue on glargine, SSI w/ accuchecks   Likely PAF: s/p left atrial appendage closure device insertion done in May 2024.High risk for bleeding as pt was on aspirin, plavix & eliquis. Eliquis was d/c as per Dr. Lucianne Muss.  Will need to follow-up with cardiologist as an outpatient for further recommendation of DOAC   Depression: severity unknown. Continue on duloxetine, buspar, trazodone    BPH: continue on flomax   Generalized weakness: PT/OT recs SNF. Pt's insurance denied SNF even after peer to peer. CM is aware  Vitamin B12 deficiency: continue w/ B12 supplement    Obesity: BMI 36.6. Would benefit from weight loss     DVT prophylaxis: lovenox Code Status:  full  Family Communication:  Disposition Plan: unclear. Pt's insurance denied SNF placement even after peer to peer. CM is aware  Status is: Inpatient Remains inpatient appropriate because: medically stable. Pt's insurance denied SNF placement even after peer to peer. CM is aware   Level of care: Med-Surg Consultants:    Procedures:   Antimicrobials:   Subjective: Pt c/o fatigue  Objective: Vitals:   01/05/23 1552 01/05/23 2150 01/06/23 0325 01/06/23 0727  BP: 122/66 138/72 128/81 (!) 149/73  Pulse: 75 80 64 73  Resp: 16 18 18 16   Temp: 98.5 F (36.9 C) 97.8 F (36.6 C) 97.6 F (36.4 C) 98.6 F (37 C)  TempSrc: Oral Oral Axillary Oral  SpO2: 98% 100% 98% 93%   Weight:      Height:       No intake or output data in the 24 hours ending 01/06/23 0834  Filed Weights   12/27/22 0931  Weight: 115.7 kg    Examination:  General exam: Appears comfortable  Respiratory system: decreased breath sounds b/l Cardiovascular system: S1 & S2+. No rubs or clicks  Gastrointestinal system: abd is soft, NT, obese & normal bowel sounds  Central nervous system: alert & awake. Moves all extremities  Psychiatry: Judgement and insight appears at baseline. Flat mood and affect    Data Reviewed: I have personally reviewed following labs and imaging studies  CBC: Recent Labs  Lab 01/01/23 0555 01/02/23 0620 01/04/23 0410 01/05/23 0412 01/06/23 0439  WBC 7.4 7.1 7.2 8.5 7.4  HGB 12.1* 12.2* 11.8* 12.0* 12.0*  HCT 34.3* 34.2* 32.8* 33.8* 34.7*  MCV 69.3* 67.6* 66.9* 69.1* 70.7*  PLT 145* 134* 141* 144* 130*   Basic Metabolic Panel: Recent Labs  Lab 12/31/22 0620 01/01/23 0555 01/02/23 0620 01/03/23 0613 01/04/23 0410 01/05/23 0412 01/06/23 0439  NA 134*   < > 139 139 137 136 140  K 5.0   < > 4.1 4.3 4.0 3.9 4.3  CL 99   < > 102 99 102 102 102  CO2 30   < > 29 26 30 28 28   GLUCOSE 156*   < > 159* 127* 236* 107* 59*  BUN 22   < > 20 23 20 21  24*  CREATININE  1.11   < > 0.85 0.93 0.85 0.83 0.90  CALCIUM 9.0   < > 9.1 9.2 8.9 8.9 9.3  MG 2.2  --   --  2.2 2.0 2.0 2.0  PHOS 4.9*  --   --  3.8 4.3 4.8* 4.6   < > = values in this interval not displayed.   GFR: Estimated Creatinine Clearance: 100.1 mL/min (by C-G formula based on SCr of 0.9 mg/dL). Liver Function Tests: No results for input(s): "AST", "ALT", "ALKPHOS", "BILITOT", "PROT", "ALBUMIN" in the last 168 hours. No results for input(s): "LIPASE", "AMYLASE" in the last 168 hours. No results for input(s): "AMMONIA" in the last 168 hours. Coagulation Profile: No results for input(s): "INR", "PROTIME" in the last 168 hours. Cardiac Enzymes: No results for input(s): "CKTOTAL", "CKMB",  "CKMBINDEX", "TROPONINI" in the last 168 hours. BNP (last 3 results) No results for input(s): "PROBNP" in the last 8760 hours. HbA1C: No results for input(s): "HGBA1C" in the last 72 hours. CBG: Recent Labs  Lab 01/05/23 0745 01/05/23 1159 01/05/23 1726 01/05/23 2138 01/06/23 0726  GLUCAP 128* 171* 128* 126* 118*   Lipid Profile: No results for input(s): "CHOL", "HDL", "LDLCALC", "TRIG", "CHOLHDL", "LDLDIRECT" in the last 72 hours. Thyroid Function Tests: No results for input(s): "TSH", "T4TOTAL", "FREET4", "T3FREE", "THYROIDAB" in the last 72 hours. Anemia Panel: No results for input(s): "VITAMINB12", "FOLATE", "FERRITIN", "TIBC", "IRON", "RETICCTPCT" in the last 72 hours. Sepsis Labs: No results for input(s): "PROCALCITON", "LATICACIDVEN" in the last 168 hours.   Recent Results (from the past 240 hour(s))  Blood culture (routine x 2)     Status: None   Collection Time: 12/27/22 12:05 PM   Specimen: BLOOD  Result Value Ref Range Status   Specimen Description BLOOD RIGHT ANTECUBITAL  Final   Special Requests   Final    BOTTLES DRAWN AEROBIC AND ANAEROBIC Blood Culture adequate volume   Culture   Final    NO GROWTH 5 DAYS Performed at [facility], [address], [place], [place] [address]    Report Status 01/01/2023 FINAL  Final  Blood culture (routine x 2)     Status: None   Collection Time: 12/27/22 12:05 PM   Specimen: BLOOD  Result Value Ref Range Status   Specimen Description BLOOD BLOOD LEFT FOREARM  Final   Special Requests   Final    BOTTLES DRAWN AEROBIC AND ANAEROBIC Blood Culture adequate volume   Culture   Final    NO GROWTH 5 DAYS Performed at [facility], [address], [place], [place] [address]    Report Status 01/01/2023 FINAL  Final         Radiology Studies: No results found.      Scheduled Meds:  acetaminophen  500 mg Oral TID   vitamin C  500 mg Oral Daily   aspirin EC  81 mg Oral Daily   busPIRone  10 mg  Oral BID   clopidogrel  75 mg Oral Daily   vitamin B-12  500 mcg Oral Daily   DULoxetine  90 mg Oral Daily   enoxaparin (LOVENOX) injection  55 mg Subcutaneous Q24H   hydrochlorothiazide  12.5 mg Oral Daily   insulin aspart  0-15 Units Subcutaneous TID WC   insulin aspart  0-5 Units Subcutaneous QHS   insulin aspart  8 Units Subcutaneous TID WC   insulin glargine-yfgn  45 Units Subcutaneous QHS   lisinopril  20 mg Oral Daily   melatonin  5 mg Oral QHS  polyvinyl alcohol  2 drop Both Eyes BID   pregabalin  50 mg Oral TID   rosuvastatin  10 mg Oral QHS   sodium chloride flush  3 mL Intravenous Q12H   sotalol  80 mg Oral Q12H   tamsulosin  0.4 mg Oral QPC supper   traZODone  25 mg Oral QHS   Vitamin D (Ergocalciferol)  50,000 Units Oral Q7 days   Continuous Infusions:  sodium chloride 10 mL/hr at 12/28/22 1211     LOS: 9 days    Time spent: 25 mins     Charise Killian, MD [facility] Pager [phone]  If 7PM-7AM, please contact night-coverage [email or web address] 01/06/2023, 8:34 AM

## 2023-01-06 NOTE — TOC Progression Note (Signed)
Transition of Care [facility]) - Progression Note    Patient Details  Name: Eric Forbes MRN: [id number] Date of Birth: 03/16/1956  [facility]) CM/SW Contact  [name], RN Phone Number: 01/06/2023, 10:36 AM  Clinical Narrative:     Insurance auth still pending Peer to peer has been completed and has to be completed by Ladene Artist today MD provided contact information        Expected Discharge Plan and Services         Expected Discharge Date: 12/31/22                                     Social Determinants of Health (SDOH) Interventions SDOH Screenings   Food Insecurity: No Food Insecurity (12/27/2022)  Housing: Low Risk  (12/27/2022)  Transportation Needs: No Transportation Needs (12/27/2022)  Utilities: Not At Risk (12/27/2022)  Tobacco Use: High Risk (12/27/2022)    Readmission Risk Interventions     No data to display

## 2023-01-07 DIAGNOSIS — L03116 Cellulitis of left lower limb: Secondary | ICD-10-CM | POA: Diagnosis not present

## 2023-01-07 LAB — MAGNESIUM: Magnesium: 2 mg/dL (ref 1.7–2.4)

## 2023-01-07 LAB — GLUCOSE, CAPILLARY
Glucose-Capillary: 93 mg/dL (ref 70–99)
Glucose-Capillary: 168 mg/dL — ABNORMAL HIGH (ref 70–99)
Glucose-Capillary: 258 mg/dL — ABNORMAL HIGH (ref 70–99)
Glucose-Capillary: 273 mg/dL — ABNORMAL HIGH (ref 70–99)

## 2023-01-07 LAB — BASIC METABOLIC PANEL
Anion gap: 7 (ref 5–15)
BUN: 21 mg/dL (ref 8–23)
CO2: 28 mmol/L (ref 22–32)
Calcium: 8.9 mg/dL (ref 8.9–10.3)
Chloride: 101 mmol/L (ref 98–111)
Creatinine, Ser: 0.81 mg/dL (ref 0.61–1.24)
GFR, Estimated: >60 mL/min (ref >60)
Glucose, Bld: 140 mg/dL — ABNORMAL HIGH (ref 70–99)
Potassium: 3.6 mmol/L (ref 3.5–5.1)
Sodium: 136 mmol/L (ref 135–145)

## 2023-01-07 LAB — TROPONIN I (HIGH SENSITIVITY)
Troponin I (High Sensitivity): 9 ng/L (ref <18)
Troponin I (High Sensitivity): 8 ng/L (ref <18)

## 2023-01-07 LAB — CBC
HCT: 30.9 % — ABNORMAL LOW (ref 39.0–52.0)
Hemoglobin: 11.3 g/dL — ABNORMAL LOW (ref 13.0–17.0)
MCH: 24.2 pg — ABNORMAL LOW (ref 26.0–34.0)
MCHC: 36.6 g/dL — ABNORMAL HIGH (ref 30.0–36.0)
MCV: 66.3 fL — ABNORMAL LOW (ref 80.0–100.0)
Platelets: 132 10*3/uL — ABNORMAL LOW (ref 150–400)
RBC: 4.66 MIL/uL (ref 4.22–5.81)
RDW: 15.8 % — ABNORMAL HIGH (ref 11.5–15.5)
WBC: 7.8 10*3/uL (ref 4.0–10.5)
nRBC: 0 % (ref 0.0–0.2)

## 2023-01-07 LAB — PHOSPHORUS: Phosphorus: 3.3 mg/dL (ref 2.5–4.6)

## 2023-01-07 MED ORDER — FLUTICASONE PROPIONATE 50 MCG/ACT NA SUSP
1.0000 | Freq: Every day | NASAL | Status: DC
  Administered 2023-01-07 – 2023-01-16 (×8): 1 via NASAL
  Filled 2023-01-07: qty 16

## 2023-01-07 MED ORDER — GUAIFENESIN ER 600 MG PO TB12
600.0000 mg | ORAL_TABLET | Freq: Two times a day (BID) | ORAL | Status: DC
  Administered 2023-01-07 – 2023-01-16 (×19): 600 mg via ORAL
  Filled 2023-01-07 (×19): qty 1

## 2023-01-07 MED ORDER — FUROSEMIDE 20 MG PO TABS
60.0000 mg | ORAL_TABLET | Freq: Two times a day (BID) | ORAL | Status: DC
  Administered 2023-01-07 – 2023-01-12 (×10): 60 mg via ORAL
  Filled 2023-01-07 (×10): qty 3

## 2023-01-07 NOTE — Progress Notes (Signed)
PROGRESS NOTE   HPI was taken from Dr. Huel Cote: Eric Forbes is a 67 y.o. male with medical history significant of atrial fibrillation on Eliquis, CVA, type 2 diabetes, hypertension, CAD, who presents to the ED due to leg infection.   Eric Forbes states that for the last 5-6 days, he has been experiencing increased pain and redness in his left lower extremity. His facility has been dressing his wounds but it has not helped at all. Yesterday, he endorses a fever. Otherwise, he denies any recent falls, nausea, vomiting, abdominal pain, chest pain or SOB.    ED course: On arrival to the ED, patient was normotensive at 139/89 with heart rate of 83.  He was saturating at 98% on room air.  He was afebrile at 97.5. Initial workup notable for WBC of 10.0, hemoglobin 13.6, glucose of 183, creatinine 0.88 with GFR above 60.  Lactic acid 1.2.  Lower extremity Doppler negative for DVT.  Patient started on Zofran, morphine and Rocephin.  TRH contacted for admission.  As per Dr. Mayford Knife 8/21-8/27/24: Pt has completed the course of abxs for LLE cellulitis. Pt continues to need daily wound care for LLE. PT/OT evaluated the pt recommend SNF. SNF was denied by pt's insurance even after peer to peer. Pt's brother appealed this decision. See CM's notes.     Eric Forbes  ZOX:[id number] DOB: 1955/10/29 DOA: 12/27/2022 PCP: [facility]   Assessment & Plan:   Principal Problem:   Lower extremity cellulitis Active Problems:   A-fib (HCC)   Type 2 diabetes mellitus (HCC)   Essential hypertension   Cellulitis of left lower extremity  Assessment and Plan: LLE cellulitis: completed abx course. Continue w/ wound care   Lower extremity edema: continue w/ leg elevation. Continue w/ home dose of lasix  HTN: continue on sotalol, HCTZ ,lisinopril. holding amlodipine    DM2: fair control, HbA1c 7.2. Continue on glargine, SSI w/ accuchecks   Likely PAF: s/p left atrial appendage closure device insertion  done in May 2024.High risk for bleeding as pt was on aspirin, plavix & eliquis. Eliquis was d/c as per Dr. Lucianne Muss.  Will need to follow-up with cardiologist as an outpatient for further recommendation of DOAC   Depression: severity unknown. Continue on trazodone, duloxetine, buspar    BPH: continue on flomax    Generalized weakness: PT/OT recs SNF. Pt's insurance denied SNF even after peer to peer. Pt's brother appealed this decision. See CM's notes   Vitamin B12 deficiency: continue w/ B12 supplement     Obesity: BMI 36.6. Would benefit from weight loss     DVT prophylaxis: lovenox Code Status:  full  Family Communication:  Disposition Plan: unclear. Pt's insurance denied SNF placement even after peer to peer. Pt's brother appealed this decision. See CM's notes  Status is: Inpatient Remains inpatient appropriate because: medically stable. Pt's insurance denied SNF placement even after peer to peer. Pt's brother appealed this decision. See CM's notes.    Level of care: Med-Surg Consultants:    Procedures:   Antimicrobials:   Subjective: Pt c/o malaise & intermittent confusion.  Objective: Vitals:   01/06/23 1900 01/06/23 2333 01/07/23 0318 01/07/23 0809  BP: 130/81 107/74 (!) 143/88 (!) 146/80  Pulse: 74 90 71 64  Resp: 18  20 16   Temp: 98.4 F (36.9 C)  97.6 F (36.4 C) 97.8 F (36.6 C)  TempSrc:   Axillary Oral  SpO2: 98% 94% 96% 100%  Weight:      Height:  Intake/Output Summary (Last 24 hours) at 01/07/2023 0836 Last data filed at 01/07/2023 0600 Gross per 24 hour  Intake 480 ml  Output --  Net 480 ml    Filed Weights   12/27/22 0931  Weight: 115.7 kg    Examination:  General exam: appears calm & comfortable  Respiratory system: diminished breath sounds b/l  Cardiovascular system: S1/S2+. No rubs or clicks   Gastrointestinal system: abd is soft, NT, obese & normal bowel sounds  Central nervous system: alert & awake. Moves all extremities   Psychiatry: Judgement and insight appears at baseline. Flat mood and affect    Data Reviewed: I have personally reviewed following labs and imaging studies  CBC: Recent Labs  Lab 01/02/23 0620 01/04/23 0410 01/05/23 0412 01/06/23 0439 01/07/23 0204  WBC 7.1 7.2 8.5 7.4 7.8  HGB 12.2* 11.8* 12.0* 12.0* 11.3*  HCT 34.2* 32.8* 33.8* 34.7* 30.9*  MCV 67.6* 66.9* 69.1* 70.7* 66.3*  PLT 134* 141* 144* 130* 132*   Basic Metabolic Panel: Recent Labs  Lab 01/03/23 0613 01/04/23 0410 01/05/23 0412 01/06/23 0439 01/07/23 0204  NA 139 137 136 140 136  K 4.3 4.0 3.9 4.3 3.6  CL 99 102 102 102 101  CO2 26 30 28 28 28   GLUCOSE 127* 236* 107* 59* 140*  BUN 23 20 21  24* 21  CREATININE 0.93 0.85 0.83 0.90 0.81  CALCIUM 9.2 8.9 8.9 9.3 8.9  MG 2.2 2.0 2.0 2.0 2.0  PHOS 3.8 4.3 4.8* 4.6 3.3   GFR: Estimated Creatinine Clearance: 111.2 mL/min (by C-G formula based on SCr of 0.81 mg/dL). Liver Function Tests: No results for input(s): "AST", "ALT", "ALKPHOS", "BILITOT", "PROT", "ALBUMIN" in the last 168 hours. No results for input(s): "LIPASE", "AMYLASE" in the last 168 hours. No results for input(s): "AMMONIA" in the last 168 hours. Coagulation Profile: No results for input(s): "INR", "PROTIME" in the last 168 hours. Cardiac Enzymes: No results for input(s): "CKTOTAL", "CKMB", "CKMBINDEX", "TROPONINI" in the last 168 hours. BNP (last 3 results) No results for input(s): "PROBNP" in the last 8760 hours. HbA1C: No results for input(s): "HGBA1C" in the last 72 hours. CBG: Recent Labs  Lab 01/06/23 1202 01/06/23 1719 01/06/23 1731 01/06/23 2127 01/07/23 0810  GLUCAP 175* 282* 281* 399* 93   Lipid Profile: No results for input(s): "CHOL", "HDL", "LDLCALC", "TRIG", "CHOLHDL", "LDLDIRECT" in the last 72 hours. Thyroid Function Tests: No results for input(s): "TSH", "T4TOTAL", "FREET4", "T3FREE", "THYROIDAB" in the last 72 hours. Anemia Panel: No results for input(s):  "VITAMINB12", "FOLATE", "FERRITIN", "TIBC", "IRON", "RETICCTPCT" in the last 72 hours. Sepsis Labs: No results for input(s): "PROCALCITON", "LATICACIDVEN" in the last 168 hours.   No results found for this or any previous visit (from the past 240 hour(s)).        Radiology Studies: No results found.      Scheduled Meds:  acetaminophen  500 mg Oral TID   vitamin C  500 mg Oral Daily   aspirin EC  81 mg Oral Daily   busPIRone  10 mg Oral BID   clopidogrel  75 mg Oral Daily   vitamin B-12  500 mcg Oral Daily   DULoxetine  90 mg Oral Daily   enoxaparin (LOVENOX) injection  55 mg Subcutaneous Q24H   hydrochlorothiazide  12.5 mg Oral Daily   insulin aspart  0-15 Units Subcutaneous TID WC   insulin aspart  0-5 Units Subcutaneous QHS   insulin aspart  8 Units Subcutaneous TID WC   insulin glargine-yfgn  45 Units Subcutaneous QHS   lisinopril  20 mg Oral Daily   melatonin  5 mg Oral QHS   polyvinyl alcohol  2 drop Both Eyes BID   pregabalin  50 mg Oral TID   rosuvastatin  10 mg Oral QHS   sodium chloride flush  3 mL Intravenous Q12H   sotalol  80 mg Oral Q12H   tamsulosin  0.4 mg Oral QPC supper   traZODone  25 mg Oral QHS   Vitamin D (Ergocalciferol)  50,000 Units Oral Q7 days   Continuous Infusions:  sodium chloride 10 mL/hr at 12/28/22 1211     LOS: 10 days    Time spent: 25 mins     Charise Killian, MD [facility] Pager [phone]  If 7PM-7AM, please contact night-coverage [email or web address] 01/07/2023, 8:36 AM

## 2023-01-07 NOTE — Plan of Care (Signed)

## 2023-01-07 NOTE — Progress Notes (Signed)
Notified by the charge nurse that pt was having chest pain and left shoulder pain of 4/10  that resolved.within minutes.  VS taken and stable, on call provider notified. Order received for  STAT EKG and 2 sets of troponin.

## 2023-01-07 NOTE — Progress Notes (Signed)
Mobility Specialist - Progress Note   01/07/23 0928  Mobility  Activity Stood at bedside;Ambulated with assistance in room;Transferred from bed to chair  Level of Assistance Contact guard assist, steadying assist  Assistive Device None  Distance Ambulated (ft) 8 ft  Activity Response Tolerated well  $Mobility charge 1 Mobility  Mobility Specialist Start Time (ACUTE ONLY) 0907  Mobility Specialist Stop Time (ACUTE ONLY) [id number]  Mobility Specialist Time Calculation (min) (ACUTE ONLY) 11 min   Pt sitting on the BSC upon entry, utilizing RA. Pt STS MinA, NT completes peri care and Pt amb to the recliner CGA-MinG. Pt left seated in the recliner with alarm set and needs within reach.  Zetta Bills Mobility Specialist 01/07/23 9:32 AM

## 2023-01-07 NOTE — TOC Progression Note (Signed)
Transition of Care [facility]) - Progression Note    Patient Details  Name: Eric Forbes MRN: [id number] Date of Birth: November 21, 1955  Transition of Care [facility]) CM/SW Contact  [name], RN Phone Number: 01/07/2023, 4:51 PM  Clinical Narrative:      Received notified by [name] at [place] that they have approved 25 days of rehab and wound care  Below are their contracted facilities.   Patient and Brother [name] updated.   TOC to reach out to facilities tomorrow to submit referral   [facility] NURSING FACILITIES: Updated January 01, 2023   [facility]:  [facility] [place]  [address] [place], [place] [address] [phone] FAX: [phone]  [facility]:  [facility] [address] of [place])   [address] [place], [place] [address] [phone] FAX: [phone]                                                             [facility] [place]  [address] [place], [place] [address] [phone] FAX: [phone]   [place]:    [facility]  [address] [place], [place] [address] [phone] FAX: [phone]  [place]:   [name] [place] ** [address] [place], [place]  [address] [phone] FAX:  [phone]  [facility]:   [facility] [place]      [address]      [address]      [place], [place]  [address]      [phone] FAX:  [phone]  [place]: [name]       [address]      [place], [place]  [address]      [phone] FAX:  [phone]  [facility]:  Kathee Polite  [address]    [place]  [address] [phone]         FAX: [phone]  [facility] [address] [place], [place]  [address] [phone] FAX: [phone] **Locked  Unit GREEN - Veterans Care Agreement  RED - On Hold for new admissions      Expected Discharge Plan and Services         Expected Discharge Date: 12/31/22                                     Social Determinants of Health (SDOH) Interventions SDOH Screenings   Food Insecurity: No Food Insecurity (12/27/2022)  Housing: Low Risk  (12/27/2022)  Transportation Needs: No Transportation Needs (12/27/2022)  Utilities: Not At Risk (12/27/2022)  Tobacco Use: High Risk (12/27/2022)    Readmission Risk Interventions     No data to display

## 2023-01-08 DIAGNOSIS — L03116 Cellulitis of left lower limb: Secondary | ICD-10-CM | POA: Diagnosis not present

## 2023-01-08 LAB — GLUCOSE, CAPILLARY
Glucose-Capillary: 148 mg/dL — ABNORMAL HIGH (ref 70–99)
Glucose-Capillary: 166 mg/dL — ABNORMAL HIGH (ref 70–99)
Glucose-Capillary: 189 mg/dL — ABNORMAL HIGH (ref 70–99)
Glucose-Capillary: 145 mg/dL — ABNORMAL HIGH (ref 70–99)

## 2023-01-08 LAB — CBC
HCT: 32.7 % — ABNORMAL LOW (ref 39.0–52.0)
Hemoglobin: 11.8 g/dL — ABNORMAL LOW (ref 13.0–17.0)
MCH: 24 pg — ABNORMAL LOW (ref 26.0–34.0)
MCHC: 36.1 g/dL — ABNORMAL HIGH (ref 30.0–36.0)
MCV: 66.6 fL — ABNORMAL LOW (ref 80.0–100.0)
Platelets: 141 10*3/uL — ABNORMAL LOW (ref 150–400)
RBC: 4.91 MIL/uL (ref 4.22–5.81)
RDW: 15.6 % — ABNORMAL HIGH (ref 11.5–15.5)
WBC: 6.5 10*3/uL (ref 4.0–10.5)
nRBC: 0 % (ref 0.0–0.2)

## 2023-01-08 NOTE — Plan of Care (Signed)
Eric Forbes

## 2023-01-08 NOTE — Progress Notes (Signed)
Occupational Therapy Treatment Patient Details Name: Eric Forbes MRN: [id number] DOB: March 21, 1956 Today's Date: 01/08/2023   History of present illness Pt is a 67 y/o M admitted on 12/27/22 after presenting with c/o pain & redness in LLE. Pt is being treated for LLE cellulitis. PMH: a-fib on Eliquis, CVA, DM2, HTN, CAD, MI   OT comments  Chart reviewed, pt greeted finishing urinating, then transferring to chair with NT. Pt requires encouragement for participation in OT but ultimately is agreeable to a brief session, then lunch was delivered. LB dressing completed with MAX A, pt attempted to doff with personal reacher but required MAX A to complete task. STS completed 4x with MIN A with frequent multi modal cueing. Pt is making progress towards goals, discharge recommendation remains appropriate.       If plan is discharge home, recommend the following:  Two people to help with walking and/or transfers;Two people to help with bathing/dressing/bathroom   Equipment Recommendations  Other (comment) (per next venue)    Recommendations for Other Services      Precautions / Restrictions Precautions Precautions: Fall Restrictions Weight Bearing Restrictions: No       Mobility Bed Mobility               General bed mobility comments: NT seated on edge of bed pre session/ in recliner post session    Transfers Overall transfer level: Needs assistance Equipment used: Rolling walker (2 wheels) Transfers: Sit to/from Stand Sit to Stand: Min assist           General transfer comment: 4 attempts from chair in prep for shower transfers     Balance Overall balance assessment: Needs assistance Sitting-balance support: No upper extremity supported, Feet supported Sitting balance-Leahy Scale: Good     Standing balance support: Bilateral upper extremity supported Standing balance-Leahy Scale: Fair                             ADL either performed or assessed with  clinical judgement   ADL Overall ADL's : Needs assistance/impaired Eating/Feeding: Set up;Sitting                   Lower Body Dressing: Maximal assistance   Toilet Transfer: Minimal assistance Toilet Transfer Details (indicate cue type and reason): simulated                Extremity/Trunk Assessment              Vision       Perception     Praxis      Cognition Arousal: Alert Behavior During Therapy: WFL for tasks assessed/performed Overall Cognitive Status: No family/caregiver present to determine baseline cognitive functioning Area of Impairment: Safety/judgement, Awareness, Problem solving                         Safety/Judgement: Decreased awareness of deficits Awareness: Emergent Problem Solving: Requires verbal cues, Requires tactile cues          Exercises      Shoulder Instructions       General Comments vss throughout; requires encouragement for participation    Pertinent Vitals/ Pain       Pain Assessment Pain Assessment: No/denies pain  Home Living  Prior Functioning/Environment              Frequency  Min 1X/week        Progress Toward Goals  OT Goals(current goals can now be found in the care plan section)  Progress towards OT goals: Progressing toward goals     Plan      Co-evaluation                 AM-PAC OT "6 Clicks" Daily Activity     Outcome Measure   Help from another person eating meals?: None Help from another person taking care of personal grooming?: A Little Help from another person toileting, which includes using toliet, bedpan, or urinal?: A Lot Help from another person bathing (including washing, rinsing, drying)?: A Lot Help from another person to put on and taking off regular upper body clothing?: A Little Help from another person to put on and taking off regular lower body clothing?: A Lot 6 Click Score: 16     End of Session Equipment Utilized During Treatment: Rolling walker (2 wheels)  OT Visit Diagnosis: Other abnormalities of gait and mobility (R26.89);Muscle weakness (generalized) (M62.81)   Activity Tolerance Patient tolerated treatment well;Other (comment) (requests session to terminate so he can eat lunch)   Patient Left in chair;with call bell/phone within reach;with chair alarm set;with family/visitor present   Nurse Communication Mobility status        Time: 1478-2956 OT Time Calculation (min): 12 min  Charges: OT General Charges $OT Visit: 1 Visit OT Treatments $Self Care/Home Management : 8-22 mins  Oleta Mouse, OTD OTR/L  01/08/23, 12:51 PM

## 2023-01-08 NOTE — Progress Notes (Signed)
Progress Note   Patient: Eric Forbes ZOX:[id number] DOB: Feb 22, 1956 DOA: 12/27/2022     11 DOS: the patient was seen and examined on 01/08/2023   Brief hospital course:  Eric Forbes states that for the last 5-6 days, he has been experiencing increased pain and redness in his left lower extremity. His facility has been dressing his wounds but it has not helped at all. Yesterday, he endorses a fever. Otherwise, he denies any recent falls, nausea, vomiting, abdominal pain, chest pain or SOB.    ED course: On arrival to the ED, patient was normotensive at 139/89 with heart rate of 83.  He was saturating at 98% on room air.  He was afebrile at 97.5. Initial workup notable for WBC of 10.0, hemoglobin 13.6, glucose of 183, creatinine 0.88 with GFR above 60.  Lactic acid 1.2.  Lower extremity Doppler negative for DVT.  Patient started on Zofran, morphine and Rocephin.  TRH contacted for admission.   As per Dr. Mayford Knife 8/21-8/27/24: Pt has completed the course of abxs for LLE cellulitis. Pt continues to need daily wound care for LLE. PT/OT evaluated the pt recommend SNF. SNF was denied by pt's insurance even after peer to peer. Pt's brother appealed this decision. See CM's notes.     Assessment and Plan:  Principal Problem:   Lower extremity cellulitis Active Problems:   A-fib (HCC)   Type 2 diabetes mellitus (HCC)   Essential hypertension   Cellulitis of left lower extremity   LLE cellulitis:  completed abx course. Continue w/ wound care     Lower extremity edema:  continue w/ leg elevation. Continue w/ home dose of lasix    HTN:  Continue on sotalol, HCTZ ,lisinopril. holding amlodipine      DM2:  fair control, HbA1c 7.2.  Maintain consistent carb diet  Glycemic control with sliding scale insulin and Glargine    Likely PAF:  s/p left atrial appendage closure device insertion done in May 2024.High risk for bleeding as pt was on aspirin, plavix & eliquis. Eliquis was d/c as per  Dr. Lucianne Muss.   Will need to follow-up with cardiologist as an outpatient for further recommendation of DOAC   Depression:  Severity unknown.  Continue on trazodone, duloxetine, buspar      BPH:  continue on flomax    Generalized weakness:  PT/OT recs SNF. Pt's insurance denied SNF even after peer to peer. Pt's brother appealed this decision. See CM's notes     Obesity:  BMI 36.6.  Complicates overall prognosis and care.         Subjective: Patient is seen and examined at the bedside.  No new complaints.  Physical Exam: Vitals:   01/07/23 2031 01/07/23 2200 01/08/23 0230 01/08/23 0743  BP: 139/68  128/69 (!) 136/99  Pulse: 77  63 76  Resp: 17  20 14   Temp: 98.4 F (36.9 C)  98 F (36.7 C) 97.9 F (36.6 C)  TempSrc: Oral  Oral Oral  SpO2: (!) 89% 95% 98% 93%  Weight:      Height:       General exam: appears calm & comfortable  Respiratory system: diminished breath sounds b/l  Cardiovascular system: S1/S2+. No rubs or clicks   Gastrointestinal system: abd is soft, NT, obese & normal bowel sounds  Central nervous system: alert & awake. Moves all extremities  Musculoskeletal: left leg is wrapped Psychiatry: Judgement and insight appears at baseline. Flat mood and affect   Data Reviewed: Labs reviewed. There are  no new results to review at this time.  Family Communication: Plan of care discussed with patient at the bedside.  Disposition: Status is: Inpatient Remains inpatient appropriate because: Awaiting placement  Planned Discharge Destination:  TBD    Time spent: 30 minutes  Author: Lucile Shutters, MD 01/08/2023 12:13 PM  For on call review [email or web address].

## 2023-01-08 NOTE — TOC Progression Note (Signed)
Transition of Care [facility]) - Progression Note    Patient Details  Name: Eric Forbes MRN: [id number] Date of Birth: 09-01-1955  Transition of Care [facility]) CM/SW Contact  [name], RN Phone Number: 01/08/2023, 1:10 PM  Clinical Narrative:     Referrals faxed to [facility] contracted facilities below - [name] [place] - VM left for admissions coordinator Jeanette [facility] [place] - notified admissions referral sent - [name] [place] [place] left - [facility] and rehab - Left VM for Lowella Bandy in admissions - [name] [place]- Per [employer] no bed availability  - [name] [place] -Per Weston Brass in admissions no bed availability   [facility] - Per Marchelle Folks they do have bed availability and will review referral  - [facility] retirement - VM left for [employer] [facility] [employer]         Expected Discharge Date: 12/31/22                                     Social Determinants of Health (SDOH) Interventions SDOH Screenings   Food Insecurity: No Food Insecurity (12/27/2022)  Housing: Low Risk  (12/27/2022)  Transportation Needs: No Transportation Needs (12/27/2022)  Utilities: Not At Risk (12/27/2022)  Tobacco Use: High Risk (12/27/2022)    Readmission Risk Interventions     No data to display

## 2023-01-08 NOTE — Progress Notes (Signed)
Mobility Specialist - Progress Note   01/08/23 1533  Mobility  Activity Ambulated with assistance in room;Transferred from chair to bed  Level of Assistance Standby assist, set-up cues, supervision of patient - no hands on  Assistive Device Front wheel walker  Distance Ambulated (ft) 6 ft  Activity Response Tolerated well  $Mobility charge 1 Mobility  Mobility Specialist Start Time (ACUTE ONLY) 1422  Mobility Specialist Stop Time (ACUTE ONLY) 1457  Mobility Specialist Time Calculation (min) (ACUTE ONLY) 35 min   Pt sitting in the recliner upon entry, utilizing RA. Pt agreeable to amb in the hallway this date. Pt STS to RW MinA, requesting to sit EOB before amb in the hallway. While seated EOB, PT enters and acquires the session. MS assist with chair follow, Pt returns to the room and left seated in the recliner with PT.   Zetta Bills Mobility Specialist 01/08/23 3:39 PM

## 2023-01-08 NOTE — Progress Notes (Signed)
Physical Therapy Treatment Patient Details Name: Eric Forbes MRN: [id number] DOB: [birth year] Today's Date: 01/08/2023   History of Present Illness Pt is a 67 y/o M admitted on 12/27/22 after presenting with c/o pain & redness in LLE. Pt is being treated for LLE cellulitis. PMH: a-fib on Eliquis, CVA, DM2, HTN, CAD, MI    PT Comments  Pt pleasant, motivated and showed good effort with PT session. He was able to improve both gait distance and quality of cadence with increased cuing per gait training.  He did not have any overt LOBs but did have occasional L foot drag, kicking walker, getting outside of walker - but made good improvements with cuing.  Overall doing well and showing good effort.      If plan is discharge home, recommend the following: A little help with walking and/or transfers;A little help with bathing/dressing/bathroom;Assistance with cooking/housework;Assistance with feeding;Help with stairs or ramp for entrance;Assist for transportation   Can travel by private vehicle     No  Equipment Recommendations       Recommendations for Other Services       Precautions / Restrictions Precautions Precautions: Fall Restrictions Weight Bearing Restrictions: No     Mobility  Bed Mobility               General bed mobility comments: NT seated on edge of bed pre session/ in recliner post session    Transfers Overall transfer level: Needs assistance Equipment used: Rolling walker (2 wheels) Transfers: Sit to/from Stand Sit to Stand: From elevated surface, Min assist, Contact guard assist           General transfer comment: Pt needed bed elevated ~2" and minA to rise from bed w/o being able to use R rail - was able to rise 3 of 4 times from recliner (able to use R UE on arm rest) needing minA on one attempt.    Ambulation/Gait Ambulation/Gait assistance: Contact guard assist Gait Distance (Feet): 70 Feet Assistive device: Rolling walker (2 wheels)          General Gait Details: 33 ft, then 30ft, then 57ft with CGA and RW.  Pt showed great motivation and with cuing and enocuagement did show increased distance and quality of gait with each effort. Consistent cuing to stay inside the walker with L foor occasionally outside of/kicking L rear slider - however with increased cueing to focus he was much more consistent with cadence and positioning on final bout of ambulation than on the first. Occsaional light hand over hand assist to insure L hand stays on handle, but good overall effort with no LOBs but did display decreased L stance time confidence.   Stairs             Wheelchair Mobility     Tilt Bed    Modified Rankin (Stroke Patients Only)       Balance Overall balance assessment: Needs assistance Sitting-balance support: No upper extremity supported, Feet supported Sitting balance-Leahy Scale: Good     Standing balance support: Bilateral upper extremity supported Standing balance-Leahy Scale: Fair Standing balance comment: able to maintain static standing w/o UE, need for L UE support during dynamic tasks                            Cognition Arousal: Alert Behavior During Therapy: WFL for tasks assessed/performed Overall Cognitive Status: No family/caregiver present to determine baseline cognitive functioning Area of Impairment: Safety/judgement, Awareness, Problem  solving                         Safety/Judgement: Decreased awareness of deficits              Exercises Other Exercises Other Exercises: standing balance acts with faded UE support.  Wide/NBOS/wide alternating side steps X 10, NBOS eyes closed static standing 2 X 10 sec    General Comments General comments (skin integrity, edema, etc.): O2 remains in 90s on room air, HR >100 on 2nd bout but below 100 generally      Pertinent Vitals/Pain Pain Assessment Pain Assessment: No/denies pain    Home Living                           Prior Function            PT Goals (current goals can now be found in the care plan section) Progress towards PT goals: Progressing toward goals    Frequency    Min 1X/week      PT Plan      Co-evaluation              AM-PAC PT "6 Clicks" Mobility   Outcome Measure  Help needed turning from your back to your side while in a flat bed without using bedrails?: A Little Help needed moving from lying on your back to sitting on the side of a flat bed without using bedrails?: A Little Help needed moving to and from a bed to a chair (including a wheelchair)?: A Little Help needed standing up from a chair using your arms (e.g., wheelchair or bedside chair)?: A Little Help needed to walk in hospital room?: A Little Help needed climbing 3-5 steps with a railing? : A Lot 6 Click Score: 17    End of Session Equipment Utilized During Treatment: Gait belt Activity Tolerance: Patient tolerated treatment well;Patient limited by fatigue (apologetically requests to stop after a few balance exercises 2/2 fatigue) Patient left: with call bell/phone within reach;with chair alarm set Nurse Communication: Mobility status PT Visit Diagnosis: Pain;Unsteadiness on feet (R26.81);Muscle weakness (generalized) (M62.81);Other abnormalities of gait and mobility (R26.89);Difficulty in walking, not elsewhere classified (R26.2) Pain - Right/Left: Left Pain - part of body: Ankle and joints of foot;Leg     Time: 1440-1504 PT Time Calculation (min) (ACUTE ONLY): 24 min  Charges:    $Gait Training: 8-22 mins $Therapeutic Exercise: 8-22 mins PT General Charges $$ ACUTE PT VISIT: 1 Visit                     Malachi Pro, DPT 01/08/2023, 3:33 PM

## 2023-01-09 DIAGNOSIS — L03116 Cellulitis of left lower limb: Secondary | ICD-10-CM | POA: Diagnosis not present

## 2023-01-09 LAB — BASIC METABOLIC PANEL
Anion gap: 10 (ref 5–15)
BUN: 24 mg/dL — ABNORMAL HIGH (ref 8–23)
CO2: 31 mmol/L (ref 22–32)
Calcium: 9.4 mg/dL (ref 8.9–10.3)
Chloride: 93 mmol/L — ABNORMAL LOW (ref 98–111)
Creatinine, Ser: 1.01 mg/dL (ref 0.61–1.24)
GFR, Estimated: >60 mL/min (ref >60)
Glucose, Bld: 234 mg/dL — ABNORMAL HIGH (ref 70–99)
Potassium: 4.2 mmol/L (ref 3.5–5.1)
Sodium: 134 mmol/L — ABNORMAL LOW (ref 135–145)

## 2023-01-09 LAB — GLUCOSE, CAPILLARY
Glucose-Capillary: 174 mg/dL — ABNORMAL HIGH (ref 70–99)
Glucose-Capillary: 213 mg/dL — ABNORMAL HIGH (ref 70–99)
Glucose-Capillary: 203 mg/dL — ABNORMAL HIGH (ref 70–99)
Glucose-Capillary: 354 mg/dL — ABNORMAL HIGH (ref 70–99)

## 2023-01-09 MED ORDER — ALBUTEROL SULFATE (2.5 MG/3ML) 0.083% IN NEBU
2.5000 mg | INHALATION_SOLUTION | Freq: Four times a day (QID) | RESPIRATORY_TRACT | Status: DC | PRN
  Administered 2023-01-13: 2.5 mg via RESPIRATORY_TRACT
  Filled 2023-01-09 (×2): qty 3

## 2023-01-09 MED ORDER — ALBUTEROL SULFATE HFA 108 (90 BASE) MCG/ACT IN AERS: 2.0000 | INHALATION_SPRAY | Freq: Four times a day (QID) | RESPIRATORY_TRACT | Status: DC | PRN

## 2023-01-09 NOTE — Progress Notes (Addendum)
Progress Note   Patient: Eric Forbes ZOX:[id number] DOB: 1955/10/06 DOA: 12/27/2022     12 DOS: the patient was seen and examined on 01/09/2023   Brief hospital course: Eric Forbes states that for the last 5-6 days, he has been experiencing increased pain and redness in his left lower extremity. His facility has been dressing his wounds but it has not helped at all. Yesterday, he endorses a fever. Otherwise, he denies any recent falls, nausea, vomiting, abdominal pain, chest pain or SOB.    ED course: On arrival to the ED, patient was normotensive at 139/89 with heart rate of 83.  He was saturating at 98% on room air.  He was afebrile at 97.5. Initial workup notable for WBC of 10.0, hemoglobin 13.6, glucose of 183, creatinine 0.88 with GFR above 60.  Lactic acid 1.2.  Lower extremity Doppler negative for DVT.  Patient started on Zofran, morphine and Rocephin.  TRH contacted for admission.   As per Dr. Mayford Knife 8/21-8/27/24: Eric Forbes has completed the course of abxs for LLE cellulitis. Eric Forbes continues to need daily wound care for LLE. Eric Forbes/OT evaluated the Eric Forbes recommend SNF. SNF was denied by Eric Forbes's insurance even after peer to peer. Eric Forbes's brother appealed this decision. See CM's notes.      Assessment and Plan:  Principal Problem:   Lower extremity cellulitis Active Problems:   A-fib (HCC)   Type 2 diabetes mellitus (HCC)   Essential hypertension   Cellulitis of left lower extremity     LLE cellulitis:  Bilateral lower extremity wounds Completed abx course.  Continue w/ wound care, bilateral LE wounds consisting of ruptured and intact, serum filled blisters and full thickness wound on anterior left foot.  Wound type:Infectious Pressure Injury POA:N/A Wound bed:red, dry Drainage (amount, consistency, odor) scant serous Periwound: edematous Dressing procedure/placement/frequency: Nursing using a daily NS cleanse followed by covering the lesions with antimicrobial nonadherent gauze (xeroform),  topping with ABD pads for comfort and protection and securement with a few turns of Kerlix roll gauze/paper tape. Feet are to be placed into Prevalon Boots. A sacral foam is to be placed for PI prevention.      Lower extremity edema:  continue w/ leg elevation.  Improved Continue w/ home dose of lasix     HTN:  Continue on sotalol, Furosemide ,lisinopril. holding amlodipine and HCTZ     DM2:  fair control, HbA1c 7.2.  Maintain consistent carb diet  Glycemic control with sliding scale insulin and Glargine     PAF: s/p left atrial appendage closure device insertion done in May 2024. Continue sotalol High risk for bleeding as Eric Forbes was on aspirin, plavix & eliquis. Eliquis was d/c as per Dr. Lucianne Muss.   Will need to follow-up with cardiologist as an outpatient for further recommendation of DOAC    Depression:  Severity unknown.  Continue on trazodone, duloxetine, buspar       BPH:  continue on flomax    Generalized weakness:  Eric Forbes/OT recs SNF. Eric Forbes's insurance denied SNF even after peer to peer. Eric Forbes's brother appealed this decision. See CM's notes      Obesity:  BMI 36.6.  Complicates overall prognosis and care.              Subjective: Patient is seen and examined at the bedside.  Feels better and has no new complaints  Physical Exam: Vitals:   01/08/23 2015 01/09/23 0246 01/09/23 0250 01/09/23 0752  BP: 131/78 106/77  138/71  Pulse: 89 62  64  Resp: 20 18  16   Temp: 98 F (36.7 C) 97.8 F (36.6 C)    TempSrc: Oral Oral    SpO2: 98% 90% 96% 99%  Weight:      Height:       General exam: appears calm & comfortable  Respiratory system: diminished breath sounds b/l  Cardiovascular system: S1/S2+. No rubs or clicks   Gastrointestinal system: abd is soft, NT, obese & normal bowel sounds  Central nervous system: alert & awake. Moves all extremities  Musculoskeletal: left leg wounds on dorsum of the left foot and anterior shin. Wounds are dry with no  purulence Psychiatry: Judgement and insight appears at baseline. Flat mood and affect   Data Reviewed:  There are no new results to review at this time.  Family Communication:  Plan of care discussed with patient in detail Disposition: Status is: Inpatient Remains inpatient appropriate because: Awaiting a safe discharge plan.  Planned Discharge Destination:  TBD    Time spent: 28 minutes  Author: Lucile Shutters, MD 01/09/2023 12:43 PM  For on call review [email or web address].

## 2023-01-09 NOTE — Plan of Care (Signed)
  Problem: Nutritional: Goal: Maintenance of adequate nutrition will improve Outcome: Progressing   Problem: Nutritional: Goal: Progress toward achieving an optimal weight will improve Outcome: Progressing   Problem: Metabolic: Goal: Ability to maintain appropriate glucose levels will improve Outcome: Progressing   Problem: Tissue Perfusion: Goal: Adequacy of tissue perfusion will improve Outcome: Progressing

## 2023-01-09 NOTE — TOC Progression Note (Signed)
Transition of Care [facility]) - Progression Note    Patient Details  Name: Eric Forbes MRN: [id number] Date of Birth: 20-Jun-1955  Transition of Care [facility]) CM/SW Contact  [name], RN Phone Number: 01/09/2023, 3:17 PM  Clinical Narrative:     - [name] -Spoke with Misty Stanley in admissions. Referral re faxed - [facility] with admission.  Re faxed referral to [id number] - [name]- [place] left -  [facility] - Message left for Marchelle Folks to follow up to see if she reviewed the referral      - [facility] - Per Shanda Bumps no male bed availability  - [facility] and rehab - Per [place], no bed availability  - [name] [place]- Per [employer] no bed availability  - [name] -Per Weston Brass in admissions no bed availability    Expected Discharge Plan and Services         Expected Discharge Date: 12/31/22                                     Social Determinants of Health (SDOH) Interventions SDOH Screenings   Food Insecurity: No Food Insecurity (12/27/2022)  Housing: Low Risk  (12/27/2022)  Transportation Needs: No Transportation Needs (12/27/2022)  Utilities: Not At Risk (12/27/2022)  Tobacco Use: High Risk (12/27/2022)    Readmission Risk Interventions     No data to display

## 2023-01-09 NOTE — Progress Notes (Signed)
Mobility Specialist - Progress Note   01/09/23 1554  Mobility  Activity Dangled on edge of bed;Transferred to/from [facility];Ambulated with assistance in room;Ambulated with assistance in hallway;Stood at bedside  Level of Assistance Standby assist, set-up cues, supervision of patient - no hands on  Assistive Device Front wheel walker  Distance Ambulated (ft) 60 ft  Activity Response Tolerated well  $Mobility charge 1 Mobility  Mobility Specialist Start Time (ACUTE ONLY) 1410  Mobility Specialist Stop Time (ACUTE ONLY) 1553  Mobility Specialist Time Calculation (min) (ACUTE ONLY) 103 min   Pt sitting EOB upon entry, utilizing RA. Pt dangled EOB for ~30 mins-- max redirection to ask, required other task be done ahead of amb. Pt STS to RW MinG, transferred to the [facility]. Pt sat on the [facility] for ~15 mins--max redirection to task. Pt amb 60 ft in the hallway, required min navigation of the RW. Pt sat in the recliner, wheeled into the room, left seated with alarm set and needs within reach.   Zetta Bills Mobility Specialist 01/09/23 4:00 PM

## 2023-01-09 NOTE — Plan of Care (Signed)
Eric Forbes

## 2023-01-09 NOTE — Plan of Care (Signed)
  Problem: Nutritional: Goal: Maintenance of adequate nutrition will improve Outcome: Progressing   Problem: Skin Integrity: Goal: Risk for impaired skin integrity will decrease Outcome: Progressing   Problem: Tissue Perfusion: Goal: Adequacy of tissue perfusion will improve Outcome: Progressing   

## 2023-01-10 ENCOUNTER — Inpatient Hospital Stay: Payer: No Typology Code available for payment source

## 2023-01-10 DIAGNOSIS — L03116 Cellulitis of left lower limb: Secondary | ICD-10-CM | POA: Diagnosis not present

## 2023-01-10 LAB — GLUCOSE, CAPILLARY
Glucose-Capillary: 224 mg/dL — ABNORMAL HIGH (ref 70–99)
Glucose-Capillary: 198 mg/dL — ABNORMAL HIGH (ref 70–99)
Glucose-Capillary: 135 mg/dL — ABNORMAL HIGH (ref 70–99)
Glucose-Capillary: 213 mg/dL — ABNORMAL HIGH (ref 70–99)
Glucose-Capillary: 184 mg/dL — ABNORMAL HIGH (ref 70–99)

## 2023-01-10 LAB — D-DIMER, QUANTITATIVE: D-Dimer, Quant: 0.53 ug{FEU}/mL — ABNORMAL HIGH (ref 0.00–0.50)

## 2023-01-10 MED ORDER — INSULIN GLARGINE-YFGN 100 UNIT/ML ~~LOC~~ SOLN
50.0000 [IU] | Freq: Every day | SUBCUTANEOUS | Status: DC
  Administered 2023-01-10 – 2023-01-15 (×6): 50 [IU] via SUBCUTANEOUS
  Filled 2023-01-10 (×7): qty 0.5

## 2023-01-10 NOTE — Progress Notes (Signed)
Progress Note   Patient: Eric Forbes UJW:[id number] DOB: 11-Apr-1956 DOA: 12/27/2022     13 DOS: the patient was seen and examined on 01/10/2023   Brief hospital course: Eric Forbes states that for the last 5-6 days, he has been experiencing increased pain and redness in his left lower extremity. His facility has been dressing his wounds but it has not helped at all. Yesterday, he endorses a fever. Otherwise, he denies any recent falls, nausea, vomiting, abdominal pain, chest pain or SOB.    ED course: On arrival to the ED, patient was normotensive at 139/89 with heart rate of 83.  He was saturating at 98% on room air.  He was afebrile at 97.5. Initial workup notable for WBC of 10.0, hemoglobin 13.6, glucose of 183, creatinine 0.88 with GFR above 60.  Lactic acid 1.2.  Lower extremity Doppler negative for DVT.  Patient started on Zofran, morphine and Rocephin.  TRH contacted for admission.   As per Dr. Mayford Knife 8/21-8/27/24: Pt has completed the course of abxs for LLE cellulitis. Pt continues to need daily wound care for LLE. PT/OT evaluated the pt recommend SNF. SNF was denied by pt's insurance even after peer to peer. Pt's brother appealed this decision. See CM's notes.          Assessment and Plan:  Principal Problem:   Lower extremity cellulitis Active Problems:   A-fib (HCC)   Type 2 diabetes mellitus (HCC)   Essential hypertension   Cellulitis of left lower extremity     LLE cellulitis:  Bilateral lower extremity wounds Completed abx course.  Improved.  Wounds are scabbed over and healing Continue w/ wound care, bilateral LE wounds consisting of ruptured and intact, serum filled blisters and full thickness wound on anterior left foot.  Wound type:Infectious Pressure Injury POA:N/A Wound bed:red, dry Drainage (amount, consistency, odor) scant serous Periwound: edematous Dressing procedure/placement/frequency: Nursing using a daily NS cleanse followed by covering the  lesions with antimicrobial nonadherent gauze (xeroform), topping with ABD pads for comfort and protection and securement with a few turns of Kerlix roll gauze/paper tape. Feet are to be placed into Prevalon Boots. A sacral foam is to be placed for PI prevention.       Lower extremity edema:  continue w/ leg elevation.  Improved Continue w/ home dose of lasix     HTN:  Continue  sotalol, Furosemide ,lisinopril.     DM2 with hyperglycemia fair control, HbA1c 7.2.  Maintain consistent carb diet  Glycemic control with sliding scale insulin and Glargine Increase dose of glargine to 50 units daily     PAF: s/p left atrial appendage closure device insertion done in May 2024. Continue sotalol High risk for bleeding as pt was on aspirin, plavix & eliquis. Eliquis was d/c as per Dr. Lucianne Muss.   Will need to follow-up with cardiologist as an outpatient for further recommendation of DOAC     Depression:  Severity unknown.  Continue on trazodone, duloxetine, buspar       BPH:  continue on flomax    Generalized weakness:  PT/OT recs SNF. Pt's insurance denied SNF even after peer to peer. Pt's brother appealed this decision. See CM's notes      Obesity:  BMI 36.6.  Complicates overall prognosis and care.   History of CVA with left-sided hemiparesis Continue dual antiplatelet therapy and rosuvastatin              Subjective: Patient is seen and examined at the bedside.  Complains  of shortness of breath.  Physical Exam: Vitals:   01/09/23 2043 01/09/23 2043 01/10/23 0525 01/10/23 0818  BP: 109/69 109/69 (!) 121/96 (!) 145/95  Pulse: 86 86 80 99  Resp: 16 20  15   Temp: (!) 97.4 F (36.3 C) (!) 97.4 F (36.3 C) 98.4 F (36.9 C) 97.7 F (36.5 C)  TempSrc: Oral  Oral Oral  SpO2: 100% 100% 95% 100%  Weight:      Height:       General exam: appears calm & comfortable  Respiratory system: diminished breath sounds b/l  Cardiovascular system: S1/S2+. No rubs or clicks    Gastrointestinal system: abd is soft, NT, obese & normal bowel sounds  Central nervous system: alert & awake. Moves all extremities  Musculoskeletal: left leg wounds on dorsum of the left foot and anterior shin. Wounds are dry with no purulence Psychiatry: Judgement and insight appears at baseline. Flat mood and affect    Data Reviewed: Labs reviewed.  Glucose 354 There are no new results to review at this time.  Family Communication: Plan of care discussed with patient in detail  Disposition: Status is: Inpatient Remains inpatient appropriate because: Awaiting discharge  Planned Discharge Destination: Home with Home Health    Time spent: 30 minutes  Author: Lucile Shutters, MD 01/10/2023 11:47 AM  For on call review [email or web address].

## 2023-01-10 NOTE — Progress Notes (Signed)
Physical Therapy Treatment Patient Details Name: Eric Forbes MRN: [id number] DOB: Jan 02, 1956 Today's Date: 01/10/2023   History of Present Illness Pt is a 67 y/o M admitted on 12/27/22 after presenting with c/o pain & redness in LLE. Pt is being treated for LLE cellulitis. PMH: a-fib on Eliquis, CVA, DM2, HTN, CAD, MI    PT Comments  Patient progressing in activity tolerance and overall functional ability, but continues to require cga/min assist for safety with all functional activities (largely due to residual L hemiparesis)  Initiated trial of 4WRW to better simulate baseline abilities.  Improved L LE alignment and overall control (less bumping walker this date) and improved ability to maintain pathway, manage RW/rollator today. Did trial HW and loftstrand during gait efforts, but patient feels most comfortable/confident with rollator and prefers to continue use    Gait tolerance remains limited to 40-50' at a time, requiring frequent and prolonged rest periods; not adequate activity tolerance or functional indep for discharge to apartment with prolonged periods of time alone at this time.  Discharge recommendations remain for continued, moderately intensive, post-acute rehab services at facility.    Of note, family considering and making arrangements for eventual transition to indep apartment with hired caregivers.  If family opts to pursue this option, will recommend initial 24/7 assist and max HH services upon discharge.  Above reviewed with TOC and patient's brother, Dwayne; all aware and in agreement.   If plan is discharge home, recommend the following: A little help with walking and/or transfers;A little help with bathing/dressing/bathroom;Assistance with cooking/housework;Assistance with feeding;Help with stairs or ramp for entrance;Assist for transportation   Can travel by private vehicle     Yes  Equipment Recommendations       Recommendations for Other Services        Precautions / Restrictions Precautions Precautions: Fall Restrictions Weight Bearing Restrictions: No     Mobility  Bed Mobility Overal bed mobility: Needs Assistance Bed Mobility: Sit to Supine     Supine to sit: Min assist     General bed mobility comments: prefers to 'crawl' into bed facing bed and pivoting towards R side during transition    Transfers Overall transfer level: Needs assistance   Transfers: Sit to/from Stand Sit to Stand: Contact guard assist           General transfer comment: multiple sit/stand in various scenarios (in isolation vs with functional activity, with various assist devices), all with cga; heavy use of R UE/LE to complete    Ambulation/Gait Ambulation/Gait assistance: Contact guard assist Gait Distance (Feet):  (40-50' x4)           General Gait Details: inconsistent stepping pattern, step height/length of L LE; excessive weight shift to R LE throughout (baseline L hemi-paresis).  Improved L LE alignment and overall control (less bumping walker this date) and improved ability to maintain pathway, manage RW/rollator today.  Did trial HW and loftstrand during gait efforts, but patient feels most comfortable/confident with rollator and prefers to continue use   Stairs             Wheelchair Mobility     Tilt Bed    Modified Rankin (Stroke Patients Only)       Balance Overall balance assessment: Needs assistance Sitting-balance support: No upper extremity supported, Feet supported Sitting balance-Leahy Scale: Good     Standing balance support: Single extremity supported Standing balance-Leahy Scale: Fair  Cognition Arousal: Alert Behavior During Therapy: WFL for tasks assessed/performed   Area of Impairment: Safety/judgement, Awareness, Problem solving                         Safety/Judgement: Decreased awareness of deficits   Problem Solving: Requires verbal  cues, Requires tactile cues General Comments: Vc's for safety and to redirect pt during session        Exercises Other Exercises Other Exercises: Lower body dressing, min/mod assist to thread LEs; min assist for sit/stand and standing balance when pulling pants over hips; mod assist to pull pants over hips.  Patient did attempt to utilize personal reacher, but still required physical assist from helper.  Reviewed hemi-dressing techniques; will continue to reinforce    General Comments        Pertinent Vitals/Pain Pain Assessment Pain Assessment: No/denies pain    Home Living                          Prior Function            PT Goals (current goals can now be found in the care plan section) Acute Rehab PT Goals Patient Stated Goal: decrease pain; improve mobility PT Goal Formulation: With patient Time For Goal Achievement: 01/11/23 Potential to Achieve Goals: Good Progress towards PT goals: Progressing toward goals    Frequency    Min 1X/week      PT Plan      Co-evaluation              AM-PAC PT "6 Clicks" Mobility   Outcome Measure  Help needed turning from your back to your side while in a flat bed without using bedrails?: A Little Help needed moving from lying on your back to sitting on the side of a flat bed without using bedrails?: A Little Help needed moving to and from a bed to a chair (including a wheelchair)?: A Little Help needed standing up from a chair using your arms (e.g., wheelchair or bedside chair)?: A Little Help needed to walk in hospital room?: A Little Help needed climbing 3-5 steps with a railing? : A Lot 6 Click Score: 17    End of Session Equipment Utilized During Treatment: Gait belt Activity Tolerance: Patient tolerated treatment well Patient left: with call bell/phone within reach;in bed;with bed alarm set Nurse Communication: Mobility status PT Visit Diagnosis: Pain;Unsteadiness on feet (R26.81);Muscle weakness  (generalized) (M62.81);Other abnormalities of gait and mobility (R26.89);Difficulty in walking, not elsewhere classified (R26.2)     Time: 3086-5784 PT Time Calculation (min) (ACUTE ONLY): 73 min  Charges:    $Gait Training: 23-37 mins $Therapeutic Activity: 38-52 mins PT General Charges $$ ACUTE PT VISIT: 1 Visit                     Bethene Hankinson H. Manson Passey, PT, DPT, NCS 01/10/23, 5:17 PM [phone]

## 2023-01-10 NOTE — Progress Notes (Signed)
Mobility Specialist - Progress Note   01/10/23 1056  Mobility  Activity Ambulated with assistance in room;Ambulated with assistance in hallway  Level of Assistance Standby assist, set-up cues, supervision of patient - no hands on  Assistive Device Front wheel walker  Distance Ambulated (ft) 120 ft  Activity Response Tolerated well  $Mobility charge 1 Mobility  Mobility Specialist Start Time (ACUTE ONLY) 1016  Mobility Specialist Stop Time (ACUTE ONLY) 1053  Mobility Specialist Time Calculation (min) (ACUTE ONLY) 37 min   Pt sitting EOB upon entry, utilizing RA. Pt agreeable to OOB amb in the hallway this date. Pt STS to RW MinG-- required physical assistance to place LUE on the RW and maintain a grip during amb. Pt amb 120 ft in the hall, stopping ~20 ft and ~120 ft for a seated rest break--- max redirection to task. Pt took a extensive rest break during the second stop, redirection to task unsuccessful. Pt wheeled into the room, left seated in the recliner with alarm set and needs within reach.  Zetta Bills Mobility Specialist 01/10/23 11:05 AM

## 2023-01-10 NOTE — Plan of Care (Signed)
  Problem: Skin Integrity: Goal: Risk for impaired skin integrity will decrease Outcome: Progressing   Problem: Safety: Goal: Ability to remain free from injury will improve Outcome: Progressing   Problem: Elimination: Goal: Will not experience complications related to bowel motility Outcome: Progressing   Problem: Coping: Goal: Level of anxiety will decrease Outcome: Progressing   Problem: Activity: Goal: Risk for activity intolerance will decrease Outcome: Progressing   Problem: Clinical Measurements: Goal: Cardiovascular complication will be avoided Outcome: Progressing

## 2023-01-10 NOTE — TOC Progression Note (Addendum)
Transition of Care [facility]) - Progression Note    Patient Details  Name: Eric Forbes MRN: [id number] Date of Birth: 01/29/1956  Transition of Care [facility] CM/SW Contact  [name], RN Phone Number: 01/10/2023, 10:34 AM  Clinical Narrative:      - [name] -Spoke with Misty Stanley. She confirms that she received the referral, and it is under review by the admissions team - [facility] left for admissions to confirm they received the referral faxed yesterday  - [name] with david in admission.  He confirms he has received the referral and will review   -  [facility] - Spoke with [place] in admissions.  Referral emailed to [email or web address]      - [facility] - Per Shanda Bumps no male bed availability  - [facility] - Per [place], no bed availability  - [name] [place]- Per [employer] no bed availability  - [name] [place] -Per Weston Brass in admissions no bed availability      Secure email sent to [facility] at [facility] to update  Spoke with [employer] and updated   Brother states that if patient is appropriate for home health services he has an apartment ready for him, he will have a person coming to the home to assist him 25 hours a week with the ability to increase services to 40 hours a week.  PT to see patient again today    Expected Discharge Plan and Services         Expected Discharge Date: 12/31/22                                     Social Determinants of Health (SDOH) Interventions SDOH Screenings   Food Insecurity: No Food Insecurity (12/27/2022)  Housing: Low Risk  (12/27/2022)  Transportation Needs: No Transportation Needs (12/27/2022)  Utilities: Not At Risk (12/27/2022)  Tobacco Use: High Risk (12/27/2022)    Readmission Risk Interventions     No data to display

## 2023-01-11 DIAGNOSIS — L03116 Cellulitis of left lower limb: Secondary | ICD-10-CM | POA: Diagnosis not present

## 2023-01-11 LAB — GLUCOSE, CAPILLARY
Glucose-Capillary: 186 mg/dL — ABNORMAL HIGH (ref 70–99)
Glucose-Capillary: 267 mg/dL — ABNORMAL HIGH (ref 70–99)
Glucose-Capillary: 284 mg/dL — ABNORMAL HIGH (ref 70–99)
Glucose-Capillary: 156 mg/dL — ABNORMAL HIGH (ref 70–99)
Glucose-Capillary: 239 mg/dL — ABNORMAL HIGH (ref 70–99)

## 2023-01-11 NOTE — Plan of Care (Signed)

## 2023-01-11 NOTE — Progress Notes (Signed)
Progress Note   Patient: Eric Forbes ZOX:[id number] DOB: 02/01/1956 DOA: 12/27/2022     14 DOS: the patient was seen and examined on 01/11/2023   Brief hospital course:  Mr. Eric Forbes states that for the last 5-6 days, he has been experiencing increased pain and redness in his left lower extremity. His facility has been dressing his wounds but it has not helped at all. Yesterday, he endorses a fever. Otherwise, he denies any recent falls, nausea, vomiting, abdominal pain, chest pain or SOB.    ED course: On arrival to the ED, patient was normotensive at 139/89 with heart rate of 83.  He was saturating at 98% on room air.  He was afebrile at 97.5. Initial workup notable for WBC of 10.0, hemoglobin 13.6, glucose of 183, creatinine 0.88 with GFR above 60.  Lactic acid 1.2.  Lower extremity Doppler negative for DVT.  Patient started on Zofran, morphine and Rocephin.  TRH contacted for admission.   As per Dr. Mayford Knife 8/21-8/27/24: Pt has completed the course of abxs for LLE cellulitis. Pt continues to need daily wound care for LLE. PT/OT evaluated the pt recommend SNF. SNF was denied by pt's insurance even after peer to peer. Pt's brother appealed this decision. See CM's notes.      Assessment and Plan:  Principal Problem:   Lower extremity cellulitis Active Problems:   A-fib (HCC)   Type 2 diabetes mellitus (HCC)   Essential hypertension   Cellulitis of left lower extremity     LLE cellulitis:  Bilateral lower extremity wounds Completed abx course.  Improved.  Wounds are scabbed over and healing Continue w/ wound care, bilateral LE wounds consisting of ruptured and intact, serum filled blisters and full thickness wound on anterior left foot.  Wound type:Infectious Pressure Injury POA:N/A Wound bed:red, dry Drainage (amount, consistency, odor) scant serous Periwound: edematous Dressing procedure/placement/frequency: Nursing using a daily NS cleanse followed by covering the lesions  with antimicrobial nonadherent gauze (xeroform), topping with ABD pads for comfort and protection and securement with a few turns of Kerlix roll gauze/paper tape. Feet are to be placed into Prevalon Boots. A sacral foam is to be placed for PI prevention.       Lower extremity edema:  continue w/ leg elevation.  Improved Continue w/ home dose of lasix     HTN:  Continue  sotalol, Furosemide ,lisinopril.     DM2 with hyperglycemia fair control, HbA1c 7.2.  Maintain consistent carb diet  Glycemic control with sliding scale insulin and Glargine Increase dose of glargine to 50 units daily     PAF: s/p left atrial appendage closure device insertion done in May 2024. Continue sotalol High risk for bleeding as pt was on aspirin, plavix & eliquis. Eliquis was d/c as per Dr. Lucianne Muss.   Will need to follow-up with cardiologist as an outpatient for further recommendation of DOAC     Depression:  Severity unknown.  Continue on trazodone, duloxetine, buspar       BPH:  continue on flomax    Generalized weakness:  PT/OT recs SNF. Pt's insurance denied SNF even after peer to peer. Pt's brother appealed this decision. See CM's notes      Obesity:  BMI 36.6.  Complicates overall prognosis and care.     History of CVA with left-sided hemiparesis Continue dual antiplatelet therapy and rosuvastatin              Subjective: Patient is seen and examined at the bedside.  Complains of  pain and tingling in both feet after ambulation.  PT  Physical Exam: Vitals:   01/10/23 2118 01/11/23 0536 01/11/23 0758 01/11/23 0948  BP: 120/83 110/76 131/87 (!) 135/90  Pulse: (!) 55 75 81 83  Resp: 20 18 18 18   Temp: 98.1 F (36.7 C) (!) 97.5 F (36.4 C) 97.8 F (36.6 C) 98 F (36.7 C)  TempSrc: Oral Oral Oral   SpO2: 99% 95% 98% 93%  Weight:      Height:       General exam: appears calm & comfortable  Respiratory system: diminished breath sounds b/l  Cardiovascular system: S1/S2+.  No rubs or clicks   Gastrointestinal system: abd is soft, NT, obese & normal bowel sounds  Central nervous system: alert & awake. Moves all extremities  Musculoskeletal: left leg wounds on dorsum of the left foot and anterior shin. Wounds are dry with no purulence Psychiatry: Judgement and insight appears at baseline. Flat mood and affect    Data Reviewed: Labs reviewed There are no new results to review at this time.  Family Communication: Plan of care discussed with patient at the bedside  Disposition: Status is: Inpatient Remains inpatient appropriate because: Awaiting discharge  Planned Discharge Destination: Home with Home Health    Time spent: 28 minutes  Author: Lucile Shutters, MD 01/11/2023 1:02 PM  For on call review [email or web address].

## 2023-01-11 NOTE — Progress Notes (Addendum)
Mobility Specialist - Progress Note   01/11/23 1100  Mobility  Activity Ambulated with assistance in hallway;Transferred from bed to chair (Simultaneous filing. User may not have seen previous data.)  Level of Assistance Standby assist, set-up cues, supervision of patient - no hands on  Assistive Device Front wheel walker  Distance Ambulated (ft) 75 ft  Activity Response Tolerated well  $Mobility charge 1 Mobility (Simultaneous filing. User may not have seen previous data.)     Pt lying in bed upon arrival, utilizing RA. Pt agreeable to activity. Completed bed mobility with minA. STS from significantly elevated bed height with assist to bring LUE to RW. Ambulation in hallway (40' + 35') with minG-CGA and 1 seated rest break. Mild L lateral lean with LLE hitting RW during gait. Pt voiced back and foot pain this date. No LOB but did have 1 episode of knee buckling in RLE when coming into standing position from chair. Pt wheeled back to room, left in chair with alarm set and needs in reach.    Filiberto Pinks Mobility Specialist 01/11/23, 11:29 AM

## 2023-01-12 DIAGNOSIS — L03116 Cellulitis of left lower limb: Secondary | ICD-10-CM | POA: Diagnosis not present

## 2023-01-12 LAB — GLUCOSE, CAPILLARY
Glucose-Capillary: 203 mg/dL — ABNORMAL HIGH (ref 70–99)
Glucose-Capillary: 259 mg/dL — ABNORMAL HIGH (ref 70–99)
Glucose-Capillary: 177 mg/dL — ABNORMAL HIGH (ref 70–99)
Glucose-Capillary: 146 mg/dL — ABNORMAL HIGH (ref 70–99)

## 2023-01-12 LAB — BASIC METABOLIC PANEL
Anion gap: 8 (ref 5–15)
BUN: 31 mg/dL — ABNORMAL HIGH (ref 8–23)
CO2: 29 mmol/L (ref 22–32)
Calcium: 8.9 mg/dL (ref 8.9–10.3)
Chloride: 98 mmol/L (ref 98–111)
Creatinine, Ser: 1.09 mg/dL (ref 0.61–1.24)
GFR, Estimated: >60 mL/min (ref >60)
Glucose, Bld: 217 mg/dL — ABNORMAL HIGH (ref 70–99)
Potassium: 4 mmol/L (ref 3.5–5.1)
Sodium: 135 mmol/L (ref 135–145)

## 2023-01-12 LAB — CBC
HCT: 31.5 % — ABNORMAL LOW (ref 39.0–52.0)
Hemoglobin: 11.5 g/dL — ABNORMAL LOW (ref 13.0–17.0)
MCH: 24.5 pg — ABNORMAL LOW (ref 26.0–34.0)
MCHC: 36.5 g/dL — ABNORMAL HIGH (ref 30.0–36.0)
MCV: 67 fL — ABNORMAL LOW (ref 80.0–100.0)
Platelets: 158 10*3/uL (ref 150–400)
RBC: 4.7 MIL/uL (ref 4.22–5.81)
RDW: 15.4 % (ref 11.5–15.5)
WBC: 7.8 10*3/uL (ref 4.0–10.5)
nRBC: 0 % (ref 0.0–0.2)

## 2023-01-12 MED ORDER — FUROSEMIDE 40 MG PO TABS
40.0000 mg | ORAL_TABLET | Freq: Every day | ORAL | Status: DC
  Administered 2023-01-13 – 2023-01-16 (×4): 40 mg via ORAL
  Filled 2023-01-12 (×4): qty 1

## 2023-01-12 NOTE — Progress Notes (Signed)
Progress Note   Patient: Eric Forbes ZOX:[id number] DOB: 1955-05-19 DOA: 12/27/2022     15 DOS: the patient was seen and examined on 01/12/2023   Brief hospital course:  Eric Forbes states that for the last 5-6 days, he has been experiencing increased pain and redness in his left lower extremity. His facility has been dressing his wounds but it has not helped at all. Yesterday, he endorses a fever. Otherwise, he denies any recent falls, nausea, vomiting, abdominal pain, chest pain or SOB.    ED course: On arrival to the ED, patient was normotensive at 139/89 with heart rate of 83.  He was saturating at 98% on room air.  He was afebrile at 97.5. Initial workup notable for WBC of 10.0, hemoglobin 13.6, glucose of 183, creatinine 0.88 with GFR above 60.  Lactic acid 1.2.  Lower extremity Doppler negative for DVT.  Patient started on Zofran, morphine and Rocephin.  TRH contacted for admission.   As per Dr. Mayford Knife 8/21-8/27/24: Pt has completed the course of abxs for LLE cellulitis. Pt continues to need daily wound care for LLE. PT/OT evaluated the pt recommend SNF. SNF was denied by pt's insurance even after peer to peer. Pt's brother appealed this decision. See CM's notes.     Assessment and Plan:   Principal Problem:   Lower extremity cellulitis Active Problems:   A-fib (HCC)   Type 2 diabetes mellitus (HCC)   Essential hypertension   Cellulitis of left lower extremity     LLE cellulitis:  Bilateral lower extremity wounds Completed abx course.  Improved.  Wounds are scabbed over and healing Continue w/ wound care, bilateral LE wounds consisting of ruptured and intact, serum filled blisters and full thickness wound on anterior left foot.  Wound type:Infectious Pressure Injury POA:N/A Wound bed:red, dry Drainage (amount, consistency, odor) scant serous Periwound: edematous Dressing procedure/placement/frequency: Nursing using a daily NS cleanse followed by covering the lesions  with antimicrobial nonadherent gauze (xeroform), topping with ABD pads for comfort and protection and securement with a few turns of Kerlix roll gauze/paper tape. Feet are to be placed into Prevalon Boots. A sacral foam is to be placed for PI prevention.       Lower extremity edema:  continue w/ leg elevation.  Improved Continue with Furosemide but decrease dose to 40mg  daily     HTN:  Continue  sotalol, Furosemide ,lisinopril.     DM2 with hyperglycemia Improved fair control, HbA1c 7.2.  Maintain consistent carb diet  Glycemic control with sliding scale insulin and Glargine Increase dose of glargine to 50 units daily     PAF: s/p left atrial appendage closure device insertion done in May 2024. Continue sotalol High risk for bleeding as pt was on aspirin, plavix & eliquis. Eliquis was d/c as per Dr. Lucianne Muss.   Will need to follow-up with cardiologist as an outpatient for further recommendation of DOAC     Depression:  Severity unknown.  Continue on trazodone, duloxetine, buspar       BPH:  continue on flomax    Generalized weakness:  PT/OT recs SNF. Pt's insurance denied SNF even after peer to peer. Pt's brother appealed this decision. See CM's notes      Obesity:  BMI 36.6.  Complicates overall prognosis and care.     History of CVA with left-sided hemiparesis Continue dual antiplatelet therapy and rosuvastatin            Subjective: Patient is seen and examined at the bedside.  Sitting up in a recliner.  Has no new complaints  Physical Exam: Vitals:   01/11/23 2000 01/12/23 0600 01/12/23 0729 01/12/23 0730  BP: (!) 132/98 110/79 108/72 115/75  Pulse: 72 71 68 81  Resp: 20 17 18    Temp: 98.5 F (36.9 C) 98.5 F (36.9 C) (!) 97.4 F (36.3 C)   TempSrc: Oral Oral Oral   SpO2: 99% 98% 94%   Weight:      Height:       General exam: appears calm & comfortable  Respiratory system: diminished breath sounds b/l  Cardiovascular system: S1/S2+. No rubs  or clicks   Gastrointestinal system: abd is soft, NT, obese & normal bowel sounds  Central nervous system: alert & awake. Moves all extremities  Musculoskeletal: left leg wounds on dorsum of the left foot and anterior shin. Wounds are dry with no purulence Psychiatry: Judgement and insight appears at baseline. Flat mood and affect   Data Reviewed: Labs reviewed There are no new results to review at this time.  Family Communication: Plan of care discussed with patient in detail.  Disposition: Status is: Inpatient Remains inpatient appropriate because: Awaiting discharge  Planned Discharge Destination: Home with Home Health    Time spent: 28 minutes  Author: Lucile Shutters, MD 01/12/2023 12:24 PM  For on call review [email or web address].

## 2023-01-12 NOTE — TOC Progression Note (Signed)
Transition of Care [facility]) - Progression Note    Patient Details  Name: Eric Forbes MRN: [id number] Date of Birth: 05-04-1956  [facility]) CM/SW Contact  Bing Quarry, RN Phone Number: 01/12/2023, 3:59 PM  Clinical Narrative:   01/12/23 RN CM contacted [name] [place] [place]; [facility] [place] [place]; [name] [place] [place] ; and [facility]; for any updates on record review for patient placement. Each facility staff told this RN CM there were no admissions staff on site most likely not until Tuesday, due to Labor Day Holiday.   Gabriel Cirri MSN RN CM  Transitions of Care Department [facility] [phone] Weekends Only          Expected Discharge Plan and Services         Expected Discharge Date: 12/31/22                                     Social Determinants of Health (SDOH) Interventions SDOH Screenings   Food Insecurity: No Food Insecurity (12/27/2022)  Housing: Low Risk  (12/27/2022)  Transportation Needs: No Transportation Needs (12/27/2022)  Utilities: Not At Risk (12/27/2022)  Tobacco Use: High Risk (12/27/2022)    Readmission Risk Interventions     No data to display

## 2023-01-12 NOTE — Plan of Care (Signed)

## 2023-01-12 NOTE — Plan of Care (Signed)
  Problem: Coping: Goal: Ability to adjust to condition or change in health will improve Outcome: Progressing   Problem: Fluid Volume: Goal: Ability to maintain a balanced intake and output will improve Outcome: Progressing   Problem: Health Behavior/Discharge Planning: Goal: Ability to identify and utilize available resources and services will improve Outcome: Progressing   Problem: Health Behavior/Discharge Planning: Goal: Ability to manage health-related needs will improve Outcome: Progressing   Problem: Metabolic: Goal: Ability to maintain appropriate glucose levels will improve Outcome: Progressing   

## 2023-01-12 NOTE — Plan of Care (Signed)

## 2023-01-13 LAB — GLUCOSE, CAPILLARY
Glucose-Capillary: 185 mg/dL — ABNORMAL HIGH (ref 70–99)
Glucose-Capillary: 191 mg/dL — ABNORMAL HIGH (ref 70–99)
Glucose-Capillary: 256 mg/dL — ABNORMAL HIGH (ref 70–99)
Glucose-Capillary: 243 mg/dL — ABNORMAL HIGH (ref 70–99)

## 2023-01-13 NOTE — Plan of Care (Signed)
  Problem: Skin Integrity: Goal: Risk for impaired skin integrity will decrease Outcome: Progressing   Problem: Nutrition: Goal: Adequate nutrition will be maintained Outcome: Progressing   

## 2023-01-13 NOTE — Progress Notes (Signed)
Mobility Specialist - Progress Note    01/13/23 1852  Mobility  Activity Ambulated with assistance in hallway  Level of Assistance Contact guard assist, steadying assist  Assistive Device Front wheel walker  Distance Ambulated (ft) 100 ft  Range of Motion/Exercises Active  Activity Response Tolerated well  Mobility Referral Yes  $Mobility charge 1 Mobility  Mobility Specialist Start Time (ACUTE ONLY) 1750  Mobility Specialist Stop Time (ACUTE ONLY) 1830  Mobility Specialist Time Calculation (min) (ACUTE ONLY) 40 min   Pt resting EOB on RA upon entry. Pt agreeable to activity. Completed bed mobility with minA. STS from significantly elevated bed height with assist to bring LUE to RW. Ambulation in hallway with minG-CGA and 1 seated rest break. Mild L lateral lean with LLE hitting RW during gait. Slight knee buckling when coming into standing position from chair. Pt wheeled back to room, and returned to bed. Left EOB with needs in reach. Bed alarm activated.    Johnathan Hausen Mobility Specialist 01/13/23, 7:01 PM

## 2023-01-13 NOTE — Progress Notes (Signed)
Progress Note   Patient: Eric Forbes UJW:[id number] DOB: Jun 01, 1955 DOA: 12/27/2022     16 DOS: the patient was seen and examined on 01/13/2023   Brief hospital course:  Mr. Raptis states that for the last 5-6 days, he has been experiencing increased pain and redness in his left lower extremity. His facility has been dressing his wounds but it has not helped at all. Yesterday, he endorses a fever. Otherwise, he denies any recent falls, nausea, vomiting, abdominal pain, chest pain or SOB.    ED course: On arrival to the ED, patient was normotensive at 139/89 with heart rate of 83.  He was saturating at 98% on room air.  He was afebrile at 97.5. Initial workup notable for WBC of 10.0, hemoglobin 13.6, glucose of 183, creatinine 0.88 with GFR above 60.  Lactic acid 1.2.  Lower extremity Doppler negative for DVT.  Patient started on Zofran, morphine and Rocephin.  TRH contacted for admission.   As per Dr. Mayford Knife 8/21-8/27/24: Pt has completed the course of abxs for LLE cellulitis. Pt continues to need daily wound care for LLE. PT/OT evaluated the pt recommend SNF. SNF was denied by pt's insurance even after peer to peer. Pt's brother appealed this decision. See CM's notes.     Assessment and Plan:   Principal Problem:   Lower extremity cellulitis Active Problems:   A-fib (HCC)   Type 2 diabetes mellitus (HCC)   Essential hypertension   Cellulitis of left lower extremity     LLE cellulitis:  Bilateral lower extremity wounds Completed abx course.  Improved.  Wounds are scabbed over and healing Continue w/ wound care, bilateral LE wounds consisting of ruptured and intact, serum filled blisters and full thickness wound on anterior left foot.  Wound type:Infectious Pressure Injury POA:N/A Wound bed:red, dry Drainage (amount, consistency, odor) scant serous Periwound: edematous Dressing procedure/placement/frequency: Nursing using a daily NS cleanse followed by covering the lesions  with antimicrobial nonadherent gauze (xeroform), topping with ABD pads for comfort and protection and securement with a few turns of Kerlix roll gauze/paper tape. Feet are to be placed into Prevalon Boots. A sacral foam is to be placed for PI prevention.       Lower extremity edema:  continue w/ leg elevation.  Improved Continue with Furosemide but decrease dose to 40mg  daily     HTN:  Continue sotalol, Furosemide, lisinopril.     DM2 with hyperglycemia Improved fair control, HbA1c 7.2.  Maintain consistent carb diet  Glycemic control with sliding scale insulin and Glargine Increase dose of glargine to 50 units daily     PAF: s/p left atrial appendage closure device insertion done in May 2024. Continue sotalol High risk for bleeding as pt was on aspirin, plavix & eliquis. Eliquis was d/c as per Dr. Lucianne Muss.   Will need to follow-up with cardiologist as an outpatient for further recommendation of DOAC     Depression:  Severity unknown.  Continue on trazodone, duloxetine, buspar       BPH:  continue on flomax    Generalized weakness:  PT/OT recs SNF. Pt's insurance denied SNF even after peer to peer. Pt's brother appealed this decision. See CM's notes  Patient will be discharged home with home health    Obesity:  BMI 36.6.  Complicates overall prognosis and care.     History of CVA with left-sided hemiparesis Continue dual antiplatelet therapy and rosuvastatin          Subjective: No new complaints  Physical  Exam: Vitals:   01/12/23 1725 01/12/23 2026 01/13/23 0610 01/13/23 0829  BP: 109/87 127/81 106/75 129/81  Pulse: 78 (!) 47 95 82  Resp: 18  20 18   Temp: 97.9 F (36.6 C) (!) 97.5 F (36.4 C) (!) 97.4 F (36.3 C) (!) 97.4 F (36.3 C)  TempSrc:  Oral Oral Oral  SpO2: 99% 97% 100% 90%  Weight:      Height:       General exam: appears calm & comfortable  Respiratory system: diminished breath sounds b/l  Cardiovascular system: S1/S2+. No rubs or  clicks   Gastrointestinal system: abd is soft, NT, obese & normal bowel sounds  Central nervous system: alert & awake. Moves all extremities  Musculoskeletal: left leg wounds on dorsum of the left foot and anterior shin. Wounds are dry with no purulence Psychiatry: Judgement and insight appears at baseline. Flat mood and affect    Data Reviewed:  There are no new results to review at this time.  Family Communication: Plan of care discussed with patient in detail.  He verbalizes understanding and agrees with the plan  Disposition: Status is: Inpatient Remains inpatient appropriate because: Awaiting discharge  Planned Discharge Destination: Home with Home Health    Time spent: 28 minutes  Author: Lucile Shutters, MD 01/13/2023 1:13 PM  For on call review [email or web address].

## 2023-01-14 DIAGNOSIS — L03116 Cellulitis of left lower limb: Secondary | ICD-10-CM | POA: Diagnosis not present

## 2023-01-14 LAB — GLUCOSE, CAPILLARY
Glucose-Capillary: 173 mg/dL — ABNORMAL HIGH (ref 70–99)
Glucose-Capillary: 168 mg/dL — ABNORMAL HIGH (ref 70–99)
Glucose-Capillary: 196 mg/dL — ABNORMAL HIGH (ref 70–99)
Glucose-Capillary: 322 mg/dL — ABNORMAL HIGH (ref 70–99)

## 2023-01-14 NOTE — Progress Notes (Signed)
Mobility Specialist - Progress Note   01/14/23 1034  Mobility  Activity Moved into chair position in bed;Stood at bedside;Ambulated with assistance in room;Ambulated with assistance in hallway  Level of Assistance Standby assist, set-up cues, supervision of patient - no hands on  Assistive Device Front wheel walker  Distance Ambulated (ft) 160 ft  Activity Response Tolerated well  $Mobility charge 1 Mobility  Mobility Specialist Start Time (ACUTE ONLY) [id number]  Mobility Specialist Stop Time (ACUTE ONLY) 1032  Mobility Specialist Time Calculation (min) (ACUTE ONLY) 45 min   Pt sitting EOB upon entry, utilizing RA. MS dons socks, Pt STS to RW SBA-- physical assistance to place LUE. Pt amb one lap around the NS, stopping once halfway for a seated rest break. Some SOB noticed while Pt rested, O2 >90%. Pt amb to the room door, sat in recliner and wheeled into the room. Pt left seated in the recliner with alarm set and needs within reach. RN notified.  Zetta Bills Mobility Specialist 01/14/23 10:41 AM

## 2023-01-14 NOTE — Plan of Care (Signed)
  Problem: Skin Integrity: Goal: Risk for impaired skin integrity will decrease Outcome: Progressing   Problem: Activity: Goal: Risk for activity intolerance will decrease Outcome: Progressing   Problem: Coping: Goal: Level of anxiety will decrease Outcome: Progressing

## 2023-01-14 NOTE — Progress Notes (Signed)
   01/14/23 1500  Spiritual Encounters  Type of Visit Initial  Care provided to: Patient  Conversation partners present during encounter Nurse  Referral source Nurse (RN/NT/LPN)  Reason for visit Routine spiritual support  OnCall Visit No  Spiritual Framework  Presenting Themes Impactful experiences and emotions;Courage hope and growth;Meaning/purpose/sources of inspiration  Community/Connection Family  Patient Stress Factors Health changes  Family Stress Factors None identified  Interventions  Spiritual Care Interventions Made Established relationship of care and support;Compassionate presence;Encouragement  Intervention Outcomes  Outcomes Connection to spiritual care;Awareness of support;Reduced isolation  Spiritual Care Plan  Spiritual Care Issues Still Outstanding No further spiritual care needs at this time (see row info)   Chaplain went to see patient at nurses request. Patient was excited to meet the chaplain and stated that he would like for me to visit again. Patient shared with the chaplain that he has had several strokes and was homeless for a while. Patient also talked to the chaplain about his relationship with his children and his relationship with his brothers. Chaplain offered the patient words of encouragement and hope.

## 2023-01-14 NOTE — Progress Notes (Signed)
Physical Therapy Treatment Patient Details Name: Eric Forbes MRN: [id number] DOB: 1956-03-06 Today's Date: 01/14/2023   History of Present Illness Pt is a 67 y/o M admitted on 12/27/22 after presenting with c/o pain & redness in LLE. Pt is being treated for LLE cellulitis. PMH: a-fib on Eliquis, CVA, DM2, HTN, CAD, MI    PT Comments  Pt seen for PT tx with pt agreeable. Pt with tangential conversation throughout session, requiring max cuing for sustained attention to task & initiation of mobility tasks. Pt is able to complete STS with supervision<>min assist & ambulate 1 lap around nurses station with rollator with close supervision with 2 seated rest breaks 2/2 fatigue. Pt demonstrates decreased safety awareness as he lifts rollator off the ground when turning in small space in room & requires CGA<>Min assist for a few steps backwards to recliner. Will continue to follow pt acutely to address balance, endurance, & gait with LRAD. PT goals reviewed & updated, see POC.   If plan is discharge home, recommend the following: A little help with walking and/or transfers;A little help with bathing/dressing/bathroom;Assistance with cooking/housework;Assistance with feeding;Help with stairs or ramp for entrance;Assist for transportation   Can travel by private vehicle     Yes  Equipment Recommendations  None recommended by PT (defer to next venue; likely none as pt reports he uses rollator at baseline)    Recommendations for Other Services       Precautions / Restrictions Precautions Precautions: Fall Precaution Comments: L hemi Restrictions Weight Bearing Restrictions: No     Mobility  Bed Mobility                    Transfers Overall transfer level: Needs assistance Equipment used: None, Rollator (4 wheels) Transfers: Sit to/from Stand Sit to Stand: Min assist, Supervision           General transfer comment: STS from recliner & rollator when parked against wall with  supervision, STS with min assist from rollator not parked against wall, pushes to stand with RUE/RLE    Ambulation/Gait Ambulation/Gait assistance: Contact guard assist, Supervision Gait Distance (Feet): 180 Feet (but 2 seated rest breaks during that time) Assistive device: Rollator (4 wheels) Gait Pattern/deviations: Decreased step length - left, Decreased dorsiflexion - left, Decreased stance time - left, Decreased weight shift to left       General Gait Details: decreased L hip/knee flexion during swing phase, decreased dorsiflexion & heel strike LLE, increased gait speed so pt fatigues quickly despite cuing for pacing himself, difficulty maintaining grasp on rollator with LUE (PT assists him with placing it on handle)   Stairs             Wheelchair Mobility     Tilt Bed    Modified Rankin (Stroke Patients Only)       Balance Overall balance assessment: Needs assistance Sitting-balance support: No upper extremity supported, Feet supported Sitting balance-Leahy Scale: Good     Standing balance support: Single extremity supported, No upper extremity supported, During functional activity, Bilateral upper extremity supported Standing balance-Leahy Scale: Fair                              Cognition Arousal: Alert Behavior During Therapy: WFL for tasks assessed/performed Overall Cognitive Status: No family/caregiver present to determine baseline cognitive functioning Area of Impairment: Safety/judgement, Awareness, Problem solving  Safety/Judgement: Decreased awareness of deficits Awareness: Emergent Problem Solving: Requires verbal cues, Requires tactile cues General Comments: tangential conversation throughout, requires max cuing for sustained attention to task        Exercises      General Comments General comments (skin integrity, edema, etc.): vss throughout      Pertinent Vitals/Pain Pain Assessment Pain  Assessment: Faces Faces Pain Scale: Hurts a little bit Pain Location: R knee Pain Descriptors / Indicators: Discomfort Pain Intervention(s): Monitored during session    Home Living Family/patient expects to be discharged to:: Assisted living                 Home Equipment: Agricultural consultant (2 wheels) Additional Comments: [facility]    Prior Function            PT Goals (current goals can now be found in the care plan section) Acute Rehab PT Goals Patient Stated Goal: decrease pain; improve mobility PT Goal Formulation: With patient Time For Goal Achievement: 01/28/23 Potential to Achieve Goals: Good Progress towards PT goals: Goals met and updated - see care plan    Frequency    Min 1X/week      PT Plan      Co-evaluation              AM-PAC PT "6 Clicks" Mobility   Outcome Measure  Help needed turning from your back to your side while in a flat bed without using bedrails?: A Little Help needed moving from lying on your back to sitting on the side of a flat bed without using bedrails?: A Little Help needed moving to and from a bed to a chair (including a wheelchair)?: A Little Help needed standing up from a chair using your arms (e.g., wheelchair or bedside chair)?: A Little Help needed to walk in hospital room?: A Little Help needed climbing 3-5 steps with a railing? : A Lot 6 Click Score: 17    End of Session   Activity Tolerance: Patient tolerated treatment well Patient left: with chair alarm set;in chair;with call bell/phone within reach Nurse Communication: Mobility status PT Visit Diagnosis: Other abnormalities of gait and mobility (R26.89);Muscle weakness (generalized) (M62.81);Unsteadiness on feet (R26.81)     Time: 2130-8657 PT Time Calculation (min) (ACUTE ONLY): 26 min  Charges:    $Therapeutic Activity: 23-37 mins PT General Charges $$ ACUTE PT VISIT: 1 Visit                     Aleda Grana, PT, DPT 01/14/23, 4:43  PM   Sandi Mariscal 01/14/2023, 4:42 PM

## 2023-01-14 NOTE — Progress Notes (Signed)
Progress Note   Patient: Eric Forbes ZOX:[id number] DOB: 15-Feb-1956 DOA: 12/27/2022     17 DOS: the patient was seen and examined on 01/14/2023   Brief hospital course:  Eric Forbes states that for the last 5-6 days, he has been experiencing increased pain and redness in his left lower extremity. His facility has been dressing his wounds but it has not helped at all. Yesterday, he endorses a fever. Otherwise, he denies any recent falls, nausea, vomiting, abdominal pain, chest pain or SOB.    ED course: On arrival to the ED, patient was normotensive at 139/89 with heart rate of 83.  He was saturating at 98% on room air.  He was afebrile at 97.5. Initial workup notable for WBC of 10.0, hemoglobin 13.6, glucose of 183, creatinine 0.88 with GFR above 60.  Lactic acid 1.2.  Lower extremity Doppler negative for DVT.  Patient started on Zofran, morphine and Rocephin.  TRH contacted for admission.   As per Dr. Mayford Forbes 8/21-8/27/24: Pt has completed the course of abxs for LLE cellulitis. Pt continues to need daily wound care for LLE. PT/OT evaluated the pt recommend SNF. SNF was denied by pt's insurance even after peer to peer. Pt's brother appealed this decision. See CM's notes.      Assessment and Plan:  Principal Problem:   Lower extremity cellulitis Active Problems:   A-fib (HCC)   Type 2 diabetes mellitus (HCC)   Essential hypertension   Cellulitis of left lower extremity     LLE cellulitis:  Bilateral lower extremity wounds Completed abx course.  Improved.  Wounds are scabbed over and healing Continue w/ wound care, bilateral LE wounds consisting of ruptured and intact, serum filled blisters and full thickness wound on anterior left foot.  Wound type:Infectious Pressure Injury POA:N/A Wound bed:red, dry Drainage (amount, consistency, odor) scant serous Periwound: edematous Dressing procedure/placement/frequency: Nursing using a daily NS cleanse followed by covering the lesions  with antimicrobial nonadherent gauze (xeroform), topping with ABD pads for comfort and protection and securement with a few turns of Kerlix roll gauze/paper tape. Feet are to be placed into Prevalon Boots. A sacral foam is to be placed for PI prevention.       Lower extremity edema:  continue w/ leg elevation.  Improved Continue with Furosemide but decrease dose to 40mg  daily     HTN:  Continue sotalol, Furosemide, lisinopril.     DM2 with hyperglycemia Improved fair control, HbA1c 7.2.  Maintain consistent carb diet  Glycemic control with sliding scale insulin and Glargine Continue glargine 50 units daily     PAF: s/p left atrial appendage closure device insertion done in May 2024. Continue sotalol High risk for bleeding as pt was on aspirin, plavix & eliquis. Eliquis was d/c as per Dr. Lucianne Muss.   Will need to follow-up with cardiologist as an outpatient for further recommendation of DOAC     Depression:  Severity unknown.  Continue on trazodone, duloxetine, buspar       BPH:  continue on flomax    Generalized weakness:  PT/OT recs SNF. Pt's insurance denied SNF even after peer to peer. Pt's brother appealed this decision. See CM's notes  Patient will be discharged home with home health     Obesity:  BMI 36.6.  Complicates overall prognosis and care.     History of CVA with left-sided hemiparesis Continue dual antiplatelet therapy and rosuvastatin              Subjective: Patient is seen  and examined at the bedside. Ambulated with PT in the hall.   Physical Exam: Vitals:   01/13/23 1336 01/13/23 1639 01/13/23 2129 01/14/23 0443  BP:  125/80 128/70 139/70  Pulse:  78 78 74  Resp:  20 19 18   Temp:  97.9 F (36.6 C) 98.2 F (36.8 C) 97.9 F (36.6 C)  TempSrc:  Oral Oral Oral  SpO2: 93% 100% 95% 94%  Weight:      Height:       General exam: appears calm & comfortable  Respiratory system: diminished breath sounds b/l  Cardiovascular system:  S1/S2+. No rubs or clicks   Gastrointestinal system: abd is soft, NT, obese & normal bowel sounds  Central nervous system: alert & awake. Moves all extremities  Musculoskeletal: left leg wounds on dorsum of the left foot and anterior shin. Wounds are dry with no purulence Psychiatry: Judgement and insight appears at baseline. Flat mood and affect     Data Reviewed:  There are no new results to review at this time.  Family Communication: Plan of care discussed with patient in detail.  Disposition: Status is: Inpatient Remains inpatient appropriate because: Awaiting discharge   Planned Discharge Destination: Skilled nursing facility    Time spent: 30 minutes  Author: Lucile Shutters, MD 01/14/2023 12:17 PM  For on call review [email or web address].

## 2023-01-14 NOTE — Progress Notes (Signed)
Occupational Therapy Re-Evaluation Patient Details Name: Eric Forbes MRN: [id number] DOB: 08/11/1955 Today's Date: 01/14/2023   History of present illness Pt is a 67 y/o M admitted on 12/27/22 after presenting with c/o pain & redness in LLE. Pt is being treated for LLE cellulitis. PMH: a-fib on Eliquis, CVA, DM2, HTN, CAD, MI   OT comments  Chart reviewed, pt greeted in room, agreeable to OT re-evaluation with goals updated. Please see care plan. Pt has made progress towards goals, however continues to require assist for all ADLs due to functional deficits. Pt amb to bathroom with RW (urgent need with RW only in room) with MIN A, toilet transfer with MIN-MOD A, MOD A for clothing management using reacher. Pt requires frequent vcs throughout for technique/safety. Pt is left as received, all needs met. NT notified of pt status. OT will follow acutely.       If plan is discharge home, recommend the following:  A lot of help with bathing/dressing/bathroom;A lot of help with walking and/or transfers   Equipment Recommendations  BSC/3in1;Tub/shower seat    Recommendations for Other Services      Precautions / Restrictions Precautions Precautions: Fall Precaution Comments: L hemi Restrictions Weight Bearing Restrictions: No       Mobility Bed Mobility               General bed mobility comments: NT in recliner pre/post session    Transfers Overall transfer level: Needs assistance Equipment used: Rolling walker (2 wheels) Transfers: Sit to/from Stand Sit to Stand: Min assist                 Balance Overall balance assessment: Needs assistance Sitting-balance support: No upper extremity supported, Feet supported Sitting balance-Leahy Scale: Good     Standing balance support: Bilateral upper extremity supported Standing balance-Leahy Scale: Fair                             ADL either performed or assessed with clinical judgement   ADL Overall ADL's :  Needs assistance/impaired Eating/Feeding: Set up;Sitting   Grooming: Sitting;Set up               Lower Body Dressing: Moderate assistance;Sitting/lateral leans Lower Body Dressing Details (indicate cue type and reason): doff pants, donn underwear and pants with use of reacher; frequent vcs for technique Toilet Transfer: Minimal assistance;Moderate assistance;Regular Toilet;Rolling walker (2 wheels);Ambulation Toilet Transfer Details (indicate cue type and reason): frequent vcs for safety/technique Toileting- Clothing Manipulation and Hygiene: Minimal assistance;Sitting/lateral lean Toileting - Clothing Manipulation Details (indicate cue type and reason): continent urination on toilet, MOD A for pants management     Functional mobility during ADLs: Minimal assistance;Cueing for safety;Cueing for sequencing;Rolling walker (2 wheels) (requested to urgently use bathroom, ok with RW use on this date with frequent vcs for RW technique)      Extremity/Trunk Assessment Upper Extremity Assessment Upper Extremity Assessment: LUE deficits/detail LUE Deficits / Details: L hemi- hx of CVA; pt reports decreased ease of AROM; provided gentle PROM and positioning to L hand   Lower Extremity Assessment Lower Extremity Assessment: Generalized weakness;Defer to PT evaluation        Vision Patient Visual Report: No change from baseline     Perception     Praxis      Cognition Arousal: Alert Behavior During Therapy: [facility] for tasks assessed/performed Overall Cognitive Status: No family/caregiver present to determine baseline cognitive functioning Area of Impairment: Safety/judgement, Awareness,  Problem solving                         Safety/Judgement: Decreased awareness of deficits Awareness: Emergent Problem Solving: Requires verbal cues, Requires tactile cues          Exercises      Shoulder Instructions       General Comments vss throughout    Pertinent Vitals/  Pain       Pain Assessment Pain Assessment: No/denies pain  Home Living Family/patient expects to be discharged to:: Assisted living                             Home Equipment: Agricultural consultant (2 wheels)   Additional Comments: [facility]      Prior Functioning/Environment              Frequency  Min 1X/week        Progress Toward Goals  OT Goals(current goals can now be found in the care plan section)  Progress towards OT goals: Progressing toward goals  Acute Rehab OT Goals Patient Stated Goal: ?discharge to personal apartment with assist from family OT Goal Formulation: With patient Time For Goal Achievement: 01/28/23 Potential to Achieve Goals: Fair ADL Goals Pt Will Perform Grooming: with set-up;sitting;standing Pt Will Perform Lower Body Dressing: with min assist;sitting/lateral leans Pt Will Transfer to Toilet: with min assist;ambulating  Plan      Co-evaluation                 AM-PAC OT "6 Clicks" Daily Activity     Outcome Measure   Help from another person eating meals?: None Help from another person taking care of personal grooming?: A Little Help from another person toileting, which includes using toliet, bedpan, or urinal?: A Little Help from another person bathing (including washing, rinsing, drying)?: A Lot Help from another person to put on and taking off regular upper body clothing?: A Little Help from another person to put on and taking off regular lower body clothing?: A Lot 6 Click Score: 17    End of Session Equipment Utilized During Treatment: Rolling walker (2 wheels)  OT Visit Diagnosis: Other abnormalities of gait and mobility (R26.89);Muscle weakness (generalized) (M62.81)   Activity Tolerance Patient tolerated treatment well   Patient Left in chair;with call bell/phone within reach;with chair alarm set   Nurse Communication  (NT re status)        Time: 0981-1914 OT Time Calculation (min): 28  min  Charges: OT General Charges $OT Visit: 1 Visit OT Evaluation $OT Re-eval: 1 Re-eval OT Treatments $Self Care/Home Management : 8-22 mins  Oleta Mouse, OTD OTR/L  01/14/23, 4:35 PM

## 2023-01-14 NOTE — TOC Progression Note (Addendum)
Transition of Care [facility]) - Progression Note    Patient Details  Name: Eric Forbes MRN: [id number] Date of Birth: Jan 08, 1956  [facility] CM/SW Contact  Margarito Liner, LCSW Phone Number: 01/14/2023, 12:21 PM  Clinical Narrative:   [facility] declined bed offer. CSW left message with Diplomatic Services operational officer at [facility]. CSW left voicemails at [facility] and [facility].  3:34 pm: Received call from [facility]. They are able to offer patient a bed. CSW left brother a voicemail to notify. If he accepts the offer, will need to call [facility] to notify and finalize the authorization approval.  Expected Discharge Plan and Services         Expected Discharge Date: 12/31/22                                     Social Determinants of Health (SDOH) Interventions SDOH Screenings   Food Insecurity: No Food Insecurity (12/27/2022)  Housing: Low Risk  (12/27/2022)  Transportation Needs: No Transportation Needs (12/27/2022)  Utilities: Not At Risk (12/27/2022)  Tobacco Use: High Risk (12/27/2022)    Readmission Risk Interventions     No data to display

## 2023-01-15 DIAGNOSIS — L03116 Cellulitis of left lower limb: Secondary | ICD-10-CM | POA: Diagnosis not present

## 2023-01-15 LAB — CBC
HCT: 31.1 % — ABNORMAL LOW (ref 39.0–52.0)
Hemoglobin: 11.2 g/dL — ABNORMAL LOW (ref 13.0–17.0)
MCH: 24.7 pg — ABNORMAL LOW (ref 26.0–34.0)
MCHC: 36 g/dL (ref 30.0–36.0)
MCV: 68.5 fL — ABNORMAL LOW (ref 80.0–100.0)
Platelets: 156 10*3/uL (ref 150–400)
RBC: 4.54 MIL/uL (ref 4.22–5.81)
RDW: 15.6 % — ABNORMAL HIGH (ref 11.5–15.5)
WBC: 6.2 10*3/uL (ref 4.0–10.5)
nRBC: 0.3 % — ABNORMAL HIGH (ref 0.0–0.2)

## 2023-01-15 LAB — BASIC METABOLIC PANEL
Anion gap: 5 (ref 5–15)
BUN: 26 mg/dL — ABNORMAL HIGH (ref 8–23)
CO2: 31 mmol/L (ref 22–32)
Calcium: 8.6 mg/dL — ABNORMAL LOW (ref 8.9–10.3)
Chloride: 98 mmol/L (ref 98–111)
Creatinine, Ser: 0.9 mg/dL (ref 0.61–1.24)
GFR, Estimated: >60 mL/min (ref >60)
Glucose, Bld: 123 mg/dL — ABNORMAL HIGH (ref 70–99)
Potassium: 4 mmol/L (ref 3.5–5.1)
Sodium: 134 mmol/L — ABNORMAL LOW (ref 135–145)

## 2023-01-15 LAB — GLUCOSE, CAPILLARY
Glucose-Capillary: 122 mg/dL — ABNORMAL HIGH (ref 70–99)
Glucose-Capillary: 237 mg/dL — ABNORMAL HIGH (ref 70–99)
Glucose-Capillary: 171 mg/dL — ABNORMAL HIGH (ref 70–99)
Glucose-Capillary: 290 mg/dL — ABNORMAL HIGH (ref 70–99)

## 2023-01-15 NOTE — Progress Notes (Signed)
Mobility Specialist - Progress Note   01/15/23 1349  Mobility  Activity Refused mobility   Pt expressed "pain in toes" refused mobility this date.   Zetta Bills Mobility Specialist 01/15/23 1:50 PM

## 2023-01-15 NOTE — Plan of Care (Signed)
  Problem: Coping: Goal: Ability to adjust to condition or change in health will improve Outcome: Progressing   Problem: Education: Goal: Ability to describe self-care measures that may prevent or decrease complications (Diabetes Survival Skills Education) will improve Outcome: Progressing   Problem: Clinical Measurements: Goal: Ability to maintain clinical measurements within normal limits will improve Outcome: Progressing   Problem: Health Behavior/Discharge Planning: Goal: Ability to manage health-related needs will improve Outcome: Progressing   Problem: Skin Integrity: Goal: Risk for impaired skin integrity will decrease Outcome: Progressing

## 2023-01-15 NOTE — TOC Progression Note (Signed)
Transition of Care [facility]) - Progression Note    Patient Details  Name: Eric Forbes MRN: [id number] Date of Birth: 1956/03/20  Transition of Care [facility]) CM/SW Contact  [name], RN Phone Number: 01/15/2023, 4:32 PM  Clinical Narrative:      Received notification from [facility]) and they can offer a bed  [name] would like to accept this bed offer Patient updated  Email, call, and text made to [name] at [place] to coordinate approval information being given to [facility]  Received call from [name] at [place] stating that [name] is off for the week. Provided [name] contact information for [name] at [place] I also called and left a VM for [name] to coordinate auth information and approval       Expected Discharge Plan and Services         Expected Discharge Date: 12/31/22                                     Social Determinants of Health (SDOH) Interventions SDOH Screenings   Food Insecurity: No Food Insecurity (12/27/2022)  Housing: Low Risk  (12/27/2022)  Transportation Needs: No Transportation Needs (12/27/2022)  Utilities: Not At Risk (12/27/2022)  Tobacco Use: High Risk (12/27/2022)    Readmission Risk Interventions     No data to display

## 2023-01-15 NOTE — Progress Notes (Signed)
PROGRESS NOTE    Eric Forbes  WGN:[id number] DOB: [birth year] DOA: 12/27/2022 PCP: [facility]    Assessment & Plan:   Principal Problem:   Lower extremity cellulitis Active Problems:   A-fib (HCC)   Type 2 diabetes mellitus (HCC)   Essential hypertension   Cellulitis of left lower extremity  Assessment and Plan: LLE cellulitis: completed abx course. Continue w/ wound care  Lower extremity edema:  continue w/ leg elevation. Continue on lasix    HTN: continue on sotalol, lisinopril, lasix  DM2: poorly controlled, HbA1c 7.2. Continue on glargine, SSI w/ accuchecks    PAF: s/p left atrial appendage closure device insertion done in May 2024. Continue sotalol. High risk for bleeding as pt was on aspirin, plavix & eliquis. Eliquis was d/c as per Dr. Lucianne Muss. Will need to follow-up with cardiologist as an outpatient for further recommendation of DOAC   Depression: severity unknown. Continue on home dose of buspar, duloxetine, trazodone   BPH: continue on flomax    Generalized weakness: PT/OT recs SNF. Pt's insurance denied SNF even after peer to peer. Pt's family appealed the denial. Will likely go to SNF tomorrow    Obesity: BMI 36.6. Would benefit from weight loss   Hx of CVA: with left-sided hemiparesis. Continue on statin, plavix, aspirin         DVT prophylaxis: lovenox  Code Status: full  Family Communication:  Disposition Plan: will likely go to SNF tomorrow as per CM   Level of care: Med-Surg Status is: Inpatient Remains inpatient appropriate because: medically stable. SNF will likely accept tomorrow     Consultants:    Procedures:   Antimicrobials:   Subjective: Pt denies any complaints   Objective: Vitals:   01/14/23 0443 01/14/23 1638 01/14/23 2011 01/15/23 0501  BP: 139/70 (!) 135/97 129/88 111/61  Pulse: 74 83 (!) 57 79  Resp: 18 18 18 19   Temp: 97.9 F (36.6 C) 98.2 F (36.8 C) 98.9 F (37.2 C) 98.5 F (36.9 C)  TempSrc: Oral   Oral   SpO2: 94% 98% 94% 97%  Weight:      Height:        Intake/Output Summary (Last 24 hours) at 01/15/2023 0730 Last data filed at 01/15/2023 0149 Gross per 24 hour  Intake 600 ml  Output 1150 ml  Net -550 ml   Filed Weights   12/27/22 0931  Weight: 115.7 kg    Examination:  General exam: Appears calm and comfortable  Respiratory system: Clear to auscultation. Respiratory effort normal. Cardiovascular system: S1 & S2 +. No rubs, gallops or clicks.  Gastrointestinal system: Abdomen is nondistended, soft and nontender.  Normal bowel sounds heard. Central nervous system: Alert and awake. Moves all extremities Psychiatry: Judgement and insight appears improved.  Mood & affect appropriate.     Data Reviewed: I have personally reviewed following labs and imaging studies  CBC: Recent Labs  Lab 01/12/23 0436 01/15/23 0458  WBC 7.8 6.2  HGB 11.5* 11.2*  HCT 31.5* 31.1*  MCV 67.0* 68.5*  PLT 158 156   Basic Metabolic Panel: Recent Labs  Lab 01/09/23 1407 01/12/23 0926 01/15/23 0458  NA 134* 135 134*  K 4.2 4.0 4.0  CL 93* 98 98  CO2 31 29 31   GLUCOSE 234* 217* 123*  BUN 24* 31* 26*  CREATININE 1.01 1.09 0.90  CALCIUM 9.4 8.9 8.6*   GFR: Estimated Creatinine Clearance: 100.1 mL/min (by C-G formula based on SCr of 0.9 mg/dL). Liver Function Tests: No  results for input(s): "AST", "ALT", "ALKPHOS", "BILITOT", "PROT", "ALBUMIN" in the last 168 hours. No results for input(s): "LIPASE", "AMYLASE" in the last 168 hours. No results for input(s): "AMMONIA" in the last 168 hours. Coagulation Profile: No results for input(s): "INR", "PROTIME" in the last 168 hours. Cardiac Enzymes: No results for input(s): "CKTOTAL", "CKMB", "CKMBINDEX", "TROPONINI" in the last 168 hours. BNP (last 3 results) No results for input(s): "PROBNP" in the last 8760 hours. HbA1C: No results for input(s): "HGBA1C" in the last 72 hours. CBG: Recent Labs  Lab 01/13/23 2143 01/14/23 0814  01/14/23 1156 01/14/23 1705 01/14/23 2020  GLUCAP 243* 173* 168* 196* 322*   Lipid Profile: No results for input(s): "CHOL", "HDL", "LDLCALC", "TRIG", "CHOLHDL", "LDLDIRECT" in the last 72 hours. Thyroid Function Tests: No results for input(s): "TSH", "T4TOTAL", "FREET4", "T3FREE", "THYROIDAB" in the last 72 hours. Anemia Panel: No results for input(s): "VITAMINB12", "FOLATE", "FERRITIN", "TIBC", "IRON", "RETICCTPCT" in the last 72 hours. Sepsis Labs: No results for input(s): "PROCALCITON", "LATICACIDVEN" in the last 168 hours.  No results found for this or any previous visit (from the past 240 hour(s)).       Radiology Studies: No results found.      Scheduled Meds:  acetaminophen  500 mg Oral TID   vitamin C  500 mg Oral Daily   aspirin EC  81 mg Oral Daily   busPIRone  10 mg Oral BID   clopidogrel  75 mg Oral Daily   vitamin B-12  500 mcg Oral Daily   DULoxetine  90 mg Oral Daily   enoxaparin (LOVENOX) injection  55 mg Subcutaneous Q24H   fluticasone  1 spray Each Nare Daily   furosemide  40 mg Oral Daily   guaiFENesin  600 mg Oral BID   insulin aspart  0-15 Units Subcutaneous TID WC   insulin aspart  0-5 Units Subcutaneous QHS   insulin aspart  8 Units Subcutaneous TID WC   insulin glargine-yfgn  50 Units Subcutaneous QHS   lisinopril  20 mg Oral Daily   melatonin  5 mg Oral QHS   polyvinyl alcohol  2 drop Both Eyes BID   pregabalin  50 mg Oral TID   rosuvastatin  10 mg Oral QHS   sodium chloride flush  3 mL Intravenous Q12H   sotalol  80 mg Oral Q12H   tamsulosin  0.4 mg Oral QPC supper   traZODone  25 mg Oral QHS   Vitamin D (Ergocalciferol)  50,000 Units Oral Q7 days   Continuous Infusions:  sodium chloride 10 mL/hr at 12/28/22 1211     LOS: 18 days    Time spent: 25 mins     Charise Killian, MD [facility] Pager [phone]  If 7PM-7AM, please contact night-coverage [email or web address] 01/15/2023, 7:30 AM

## 2023-01-16 DIAGNOSIS — L03116 Cellulitis of left lower limb: Secondary | ICD-10-CM | POA: Diagnosis not present

## 2023-01-16 LAB — GLUCOSE, CAPILLARY
Glucose-Capillary: 111 mg/dL — ABNORMAL HIGH (ref 70–99)
Glucose-Capillary: 267 mg/dL — ABNORMAL HIGH (ref 70–99)

## 2023-01-16 LAB — CBC
HCT: 34.1 % — ABNORMAL LOW (ref 39.0–52.0)
Hemoglobin: 12 g/dL — ABNORMAL LOW (ref 13.0–17.0)
MCH: 24.1 pg — ABNORMAL LOW (ref 26.0–34.0)
MCHC: 35.2 g/dL (ref 30.0–36.0)
MCV: 68.5 fL — ABNORMAL LOW (ref 80.0–100.0)
Platelets: 161 10*3/uL (ref 150–400)
RBC: 4.98 MIL/uL (ref 4.22–5.81)
RDW: 15.6 % — ABNORMAL HIGH (ref 11.5–15.5)
WBC: 6.1 10*3/uL (ref 4.0–10.5)
nRBC: 0 % (ref 0.0–0.2)

## 2023-01-16 LAB — BASIC METABOLIC PANEL
Anion gap: 8 (ref 5–15)
BUN: 21 mg/dL (ref 8–23)
CO2: 30 mmol/L (ref 22–32)
Calcium: 9 mg/dL (ref 8.9–10.3)
Chloride: 99 mmol/L (ref 98–111)
Creatinine, Ser: 0.82 mg/dL (ref 0.61–1.24)
GFR, Estimated: >60 mL/min (ref >60)
Glucose, Bld: 92 mg/dL (ref 70–99)
Potassium: 4.1 mmol/L (ref 3.5–5.1)
Sodium: 137 mmol/L (ref 135–145)

## 2023-01-16 NOTE — Progress Notes (Signed)
Physical Therapy Treatment Patient Details Name: Eric Forbes MRN: [id number] DOB: Jun 26, 1955 Today's Date: 01/16/2023   History of Present Illness Pt is a 67 y/o M admitted on 12/27/22 after presenting with c/o pain & redness in LLE. Pt is being treated for LLE cellulitis. PMH: a-fib on Eliquis, CVA, DM2, HTN, CAD, MI    PT Comments  Pt was long sitting in bed. Endorses wanting to go to [facility] but impulsivity gets up without AD and stumbles to [facility]. Pt is impulsive but was agreeable. He was able to ambulate almost full lap around the RN station with use of RW. Pt has poor safety awareness and remains high fall risk. DC recs remain appropriate.    If plan is discharge home, recommend the following: A little help with walking and/or transfers;A little help with bathing/dressing/bathroom;Assistance with cooking/housework;Assistance with feeding;Help with stairs or ramp for entrance;Assist for transportation     Equipment Recommendations  Other (comment) (Defer to next level of care)       Precautions / Restrictions Precautions Precautions: Fall Precaution Comments: L hemi Restrictions Weight Bearing Restrictions: No     Mobility  Bed Mobility Overal bed mobility: Needs Assistance Bed Mobility: Supine to Sit  Supine to sit: Contact guard   Transfers Overall transfer level: Needs assistance Equipment used: Rolling walker (2 wheels), None Transfers: Sit to/from Stand Sit to Stand: Min assist, Mod assist, Supervision  General transfer comment: pt presents with inconsistent transfer ability. Impulsively gets up form [facility] to [facility] without AD. required min-mod assist to stand from [facility].    Ambulation/Gait Ambulation/Gait assistance: Contact guard assist, Supervision Gait Distance (Feet): 160 Feet Assistive device: Rolling walker (2 wheels) Gait Pattern/deviations: Step-through pattern Gait velocity: decreased  General Gait Details: Pt was able to ambulate 160 ft with RW but poor safety  awareness overall.   Balance Overall balance assessment: Needs assistance Sitting-balance support: No upper extremity supported, Feet supported Sitting balance-Leahy Scale: Good     Standing balance support: Bilateral upper extremity supported, During functional activity, Reliant on assistive device for balance Standing balance-Leahy Scale: Poor     Cognition Arousal: Alert Behavior During Therapy: WFL for tasks assessed/performed, Impulsive Overall Cognitive Status: History of cognitive impairments - at baseline Area of Impairment: Safety/judgement, Awareness, Problem solving    Safety/Judgement: Decreased awareness of deficits   Problem Solving: Requires verbal cues, Requires tactile cues                 Pertinent Vitals/Pain Pain Assessment Pain Assessment: No/denies pain Pain Score: 0-No pain Pain Intervention(s): Limited activity within patient's tolerance, Monitored during session     PT Goals (current goals can now be found in the care plan section) Acute Rehab PT Goals Patient Stated Goal: none stated Progress towards PT goals: Progressing toward goals    Frequency    Min 1X/week       Co-evaluation     PT goals addressed during session: Mobility/safety with mobility;Balance;Proper use of DME;Strengthening/ROM        AM-PAC PT "6 Clicks" Mobility   Outcome Measure  Help needed turning from your back to your side while in a flat bed without using bedrails?: A Little Help needed moving from lying on your back to sitting on the side of a flat bed without using bedrails?: A Little Help needed moving to and from a bed to a chair (including a wheelchair)?: A Little Help needed standing up from a chair using your arms (e.g., wheelchair or bedside chair)?: A Little  Help needed to walk in hospital room?: A Little Help needed climbing 3-5 steps with a railing? : A Lot 6 Click Score: 17    End of Session   Activity Tolerance: Patient tolerated treatment  well Patient left: in chair;with call bell/phone within reach;with nursing/sitter in room;with family/visitor present Nurse Communication: Mobility status PT Visit Diagnosis: Other abnormalities of gait and mobility (R26.89);Muscle weakness (generalized) (M62.81);Unsteadiness on feet (R26.81) Pain - Right/Left: Left Pain - part of body: Ankle and joints of foot;Leg     Time: 7829-5621 PT Time Calculation (min) (ACUTE ONLY): 33 min  Charges:    $Gait Training: 8-22 mins $Therapeutic Activity: 8-22 mins PT General Charges $$ ACUTE PT VISIT: 1 Visit                     Jetta Lout PTA 01/16/23, 12:29 PM

## 2023-01-16 NOTE — Plan of Care (Signed)
  Problem: Fluid Volume: Goal: Ability to maintain a balanced intake and output will improve Outcome: Progressing   Problem: Coping: Goal: Ability to adjust to condition or change in health will improve Outcome: Progressing   Problem: Pain Managment: Goal: General experience of comfort will improve Outcome: Progressing

## 2023-01-16 NOTE — Progress Notes (Signed)
Report called to [facility]

## 2023-01-16 NOTE — TOC Transition Note (Signed)
Transition of Care [facility]) - CM/SW Discharge Note   Patient Details  Name: Eric Forbes MRN: [id number] Date of Birth: 12-Oct-1955  [facility] CM/SW Contact:  [name], RN Phone Number: 01/16/2023, 2:35 PM   Clinical Narrative:       [name] [facility] [place] confirms that she has auth and patient can admit today  Patient [facility]  Anticipated DC date: 01/16/23  Family notified: Brother Manufacturing systems engineer by: Hassie Bruce   Per MD patient ready for DC to . RN, patient, patient's family, and facility notified of DC. Discharge Summary sent to facility. RN given number for report.  TOC signing off.        Patient Goals and CMS Choice      Discharge Placement                         Discharge Plan and Services Additional resources added to the After Visit Summary for                                       Social Determinants of Health (SDOH) Interventions SDOH Screenings   Food Insecurity: No Food Insecurity (12/27/2022)  Housing: Low Risk  (12/27/2022)  Transportation Needs: No Transportation Needs (12/27/2022)  Utilities: Not At Risk (12/27/2022)  Tobacco Use: High Risk (12/27/2022)     Readmission Risk Interventions     No data to display

## 2023-01-16 NOTE — Discharge Summary (Signed)
Physician Discharge Summary  Eric Forbes OZH:[id number] DOB: Sep 19, 1955 DOA: 12/27/2022  PCP: [facility]  Admit date: 12/27/2022 Discharge date: 01/16/2023  Admitted From: home  Disposition:  SNF  Recommendations for Outpatient Follow-up:  Follow up with PCP in 1-2 weeks F/u w/ cardio in 1-2 weeks  Home Health: no  Equipment/Devices:  Discharge Condition: stable  CODE STATUS: full Diet recommendation: Heart Healthy / Carb Modified   Brief/Interim Summary: HPI was taken from Dr. Huel Cote: Eric Forbes is a 67 y.o. male with medical history significant of atrial fibrillation on Eliquis, CVA, type 2 diabetes, hypertension, CAD, who presents to the ED due to leg infection.   Eric Forbes states that for the last 5-6 days, he has been experiencing increased pain and redness in his left lower extremity. His facility has been dressing his wounds but it has not helped at all. Yesterday, he endorses a fever. Otherwise, he denies any recent falls, nausea, vomiting, abdominal pain, chest pain or SOB.    ED course: On arrival to the ED, patient was normotensive at 139/89 with heart rate of 83.  He was saturating at 98% on room air.  He was afebrile at 97.5. Initial workup notable for WBC of 10.0, hemoglobin 13.6, glucose of 183, creatinine 0.88 with GFR above 60.  Lactic acid 1.2.  Lower extremity Doppler negative for DVT.  Patient started on Zofran, morphine and Rocephin.  TRH contacted for admission.  As per Dr. Mayford Knife 8/21-8/27/24 & 9/4-01/16/23: Pt has completed the course of abxs for LLE cellulitis. Pt continues to need daily wound care for LLE. PT/OT evaluated the pt recommend SNF     Discharge Diagnoses:  Principal Problem:   Lower extremity cellulitis Active Problems:   A-fib (HCC)   Type 2 diabetes mellitus (HCC)   Essential hypertension   Cellulitis of left lower extremity LLE cellulitis: completed abx course. Continue w/ wound care  Lower extremity edema:  continue w/  leg elevation. Continue on lasix    HTN: continue on sotalol, lisinopril, lasix  DM2: poorly controlled, HbA1c 7.2. Continue on glargine, SSI w/ accuchecks    PAF: s/p left atrial appendage closure device insertion done in May 2024. Continue sotalol. High risk for bleeding as pt was on aspirin, plavix & eliquis. Eliquis was d/c as per Dr. Lucianne Muss. Will need to follow-up with cardiologist as an outpatient for further recommendation of DOAC   Depression: severity unknown. Continue on home dose of buspar, duloxetine, trazodone   BPH: continue on flomax    Generalized weakness: PT/OT recs SNF. Pt's insurance denied SNF even after peer to peer. Pt's family appealed the denial. Will likely go to SNF tomorrow    Obesity: BMI 36.6. Would benefit from weight loss   Hx of CVA: with left-sided hemiparesis. Continue on statin, plavix, aspirin    Discharge Instructions  Discharge Instructions     Call MD for:  redness, tenderness, or signs of infection (pain, swelling, redness, odor or green/yellow discharge around incision site)   Complete by: As directed    Call MD for:  severe uncontrolled pain   Complete by: As directed    Call MD for:  temperature >100.4   Complete by: As directed    Diet - low sodium heart healthy   Complete by: As directed    Diet Carb Modified   Complete by: As directed    Discharge instructions   Complete by: As directed    Follow-up with PCP, patient should be seen by an  MD in 1 to 2 days, monitor BP and titrate medication accordingly.  Decreased lisinopril from 40 to 20 mg and decrease Lasix from 60 twice daily to 40 daily.  Monitor renal functions, repeat BMP next week.  Hold the Lasix for a few days and then restart. Discontinued Eliquis, patient was on aspirin and Plavix, triple therapy can cause more bleeding risk.  Follow-up with cardiology for DOAC if needed as per EMR review patient had atrial appendage closure procedure so may not need anticoagulation.    Discharge instructions   Complete by: As directed    F/u w/ PCP in 1-2 weeks   Discharge wound care:   Complete by: As directed    As above   Discharge wound care:   Complete by: As directed    Wound care  Daily      Comments: Wound care to bilateral LE intact and ruptured blisters, including full thickness wound on left anterior foot:  Cleanse with NS, pat dry. Cover with folded layers of xeroform gauze [place] # [id number]) top with ABD pads and secure with Kerlix roll gauze/paper tape. Change daily.   Increase activity slowly   Complete by: As directed    Increase activity slowly   Complete by: As directed       Allergies as of 01/16/2023       Reactions   Bee Venom Anaphylaxis        Medication List     STOP taking these medications    cephALEXin 500 MG capsule Commonly known as: KEFLEX   Eliquis 5 MG Tabs tablet Generic drug: apixaban       TAKE these medications    acetaminophen 500 MG tablet Commonly known as: TYLENOL Take 500 mg by mouth 3 (three) times daily.   albuterol 108 (90 Base) MCG/ACT inhaler Commonly known as: VENTOLIN HFA Inhale 2 puffs into the lungs every 6 (six) hours as needed for shortness of breath.   amLODipine 5 MG tablet Commonly known as: NORVASC Take 1 tablet (5 mg total) by mouth daily. Skip the dose if systolic BP less than 140 mmHg What changed: additional instructions   ascorbic acid 500 MG tablet Commonly known as: VITAMIN C Take 1 tablet (500 mg total) by mouth daily.   aspirin EC 81 MG tablet Take 81 mg by mouth daily.   busPIRone 10 MG tablet Commonly known as: BUSPAR Take 10 mg by mouth 2 (two) times daily.   butalbital-acetaminophen-caffeine 50-325-40 MG tablet Commonly known as: FIORICET Take 1 tablet by mouth every 6 (six) hours as needed for headache.   carboxymethylcellulose 0.5 % Soln Commonly known as: REFRESH PLUS Place 2 drops into both eyes 2 (two) times daily.   cholecalciferol 25 MCG (1000 UNIT)  tablet Commonly known as: VITAMIN D3 Take 2,000 Units by mouth daily.   clopidogrel 75 MG tablet Commonly known as: PLAVIX Take 75 mg by mouth daily.   cyanocobalamin 500 MCG tablet Commonly known as: VITAMIN B12 Take 1 tablet (500 mcg total) by mouth daily.   cyclobenzaprine 10 MG tablet Commonly known as: FLEXERIL Take 1 tablet (10 mg total) by mouth 3 (three) times daily as needed for muscle spasms.   diclofenac Sodium 1 % Gel Commonly known as: VOLTAREN Apply 2 g topically 3 (three) times daily.   DULoxetine 30 MG capsule Commonly known as: CYMBALTA Take 30 mg by mouth daily. Take along with one 60 mg capsule for total 90 mg once daily   DULoxetine 60 MG  capsule Commonly known as: CYMBALTA Take 60 mg by mouth daily. Take along with one 30 mg capsule for total 90 mg once daily   EpiPen 2-Pak 0.3 mg/0.3 mL Soaj injection Generic drug: EPINEPHrine Inject 0.3 mg into the muscle as needed for anaphylaxis.   furosemide 40 MG tablet Commonly known as: LASIX Take 1 tablet (40 mg total) by mouth daily. What changed:  how much to take when to take this   HYDROcodone-acetaminophen 5-325 MG tablet Commonly known as: NORCO/VICODIN Take 1 tablet by mouth every 6 (six) hours as needed. Hold for Lethargy What changed: reasons to take this   hydrOXYzine 25 MG tablet Commonly known as: ATARAX Take 25 mg by mouth every 8 (eight) hours as needed for anxiety.   insulin aspart 100 UNIT/ML injection Commonly known as: novoLOG Inject 28 Units into the skin 3 (three) times daily before meals.   iron polysaccharides 150 MG capsule Commonly known as: NIFEREX Take 1 capsule (150 mg total) by mouth daily.   lidocaine 5 % Commonly known as: LIDODERM Place 1 patch onto the skin daily. Remove & Discard patch within 12 hours or as directed by MD   lisinopril 20 MG tablet Commonly known as: ZESTRIL Take 1 tablet (20 mg total) by mouth daily. What changed:  medication strength how  much to take   melatonin 5 MG Tabs Take 5 mg by mouth at bedtime.   naloxone 4 MG/0.1ML Liqd nasal spray kit Commonly known as: NARCAN Place 1 spray into the nose once.   neomycin-bacitracin-polymyxin Oint Commonly known as: NEOSPORIN Apply 1 Application topically daily.   polyethylene glycol 17 g packet Commonly known as: MIRALAX / GLYCOLAX Take 17 g by mouth daily as needed for mild constipation.   pregabalin 50 MG capsule Commonly known as: LYRICA Take 50 mg by mouth 3 (three) times daily.   rosuvastatin 10 MG tablet Commonly known as: CRESTOR Take 10 mg by mouth at bedtime.   Semglee 100 UNIT/ML injection Generic drug: insulin glargine Inject 70 Units into the skin at bedtime.   sotalol 80 MG tablet Commonly known as: BETAPACE Take 80 mg by mouth every 12 (twelve) hours.   tamsulosin 0.4 MG Caps capsule Commonly known as: FLOMAX Take 0.4 mg by mouth daily.   traZODone 50 MG tablet Commonly known as: DESYREL Take 25 mg by mouth at bedtime.               Discharge Care Instructions  (From admission, onward)           Start     Ordered   01/16/23 0000  Discharge wound care:       Comments: Wound care  Daily      Comments: Wound care to bilateral LE intact and ruptured blisters, including full thickness wound on left anterior foot:  Cleanse with NS, pat dry. Cover with folded layers of xeroform gauze [place] # [id number]) top with ABD pads and secure with Kerlix roll gauze/paper tape. Change daily.   01/16/23 1305   12/31/22 0000  Discharge wound care:       Comments: As above   12/31/22 1340            Contact information for after-discharge care     Destination     [facility] Preferred SNF .   Service: Skilled Paramedic information: [address] [place] [address] [phone]  Allergies  Allergen Reactions    Bee Venom Anaphylaxis    Consultations:    Procedures/Studies: DG Chest Port 1 View  Result Date: 01/10/2023 CLINICAL DATA:  Shortness of breath for a couple days. EXAM: PORTABLE CHEST 1 VIEW COMPARISON:  Radiographs 09/20/2019 and 09/18/2019. FINDINGS: 1206 hours. The heart size and mediastinal contours are stable. There are lower lung volumes with mild resulting patchy atelectasis at both lung bases. No edema, confluent airspace opacity, significant pleural effusion or pneumothorax demonstrated. The bones appear unremarkable. IMPRESSION: Lower lung volumes with mild bibasilar atelectasis. No acute cardiopulmonary process. Electronically Signed   By: Carey Bullocks M.D.   On: 01/10/2023 15:58   US Venous Img Lower Unilateral Left  Result Date: 12/27/2022 CLINICAL DATA:  Swelling, pain EXAM: LEFT LOWER EXTREMITY VENOUS DOPPLER ULTRASOUND TECHNIQUE: Gray-scale sonography with compression, as well as color and duplex ultrasound, were performed to evaluate the deep venous system(s) from the level of the common femoral vein through the popliteal and proximal calf veins. COMPARISON:  Chest XR, 09/20/2019. FINDINGS: VENOUS Normal compressibility of the common femoral, superficial femoral, and popliteal veins, as well as the visualized calf veins. Visualized portions of profunda femoral vein and great saphenous vein unremarkable. No filling defects to suggest DVT on grayscale or color Doppler imaging. Doppler waveforms show normal direction of venous flow, normal respiratory plasticity and response to augmentation. Limited views of the contralateral common femoral vein are unremarkable. OTHER No evidence of superficial thrombophlebitis or abnormal fluid collection. Subcutaneous edema of the imaged distal LEFT lower extremity. Limitations: Patient body habitus IMPRESSION: No evidence of femoropopliteal DVT or superficial thrombophlebitis within the LEFT lower extremity. Roanna Banning, MD [facility] Radiology Electronically Signed   By: Roanna Banning M.D.   On: 12/27/2022 13:55   (Echo, Carotid, EGD, Colonoscopy, ERCP)    Subjective: Pt denies any complaints    Discharge Exam: Vitals:   01/16/23 0458 01/16/23 0802  BP: (!) 140/88 134/81  Pulse: 66 61  Resp: 20 18  Temp: 97.7 F (36.5 C) 98 F (36.7 C)  SpO2: 96% 100%   Vitals:   01/15/23 1955 01/15/23 2118 01/16/23 0458 01/16/23 0802  BP: (!) 152/86 128/85 (!) 140/88 134/81  Pulse: 84 (!) 56 66 61  Resp: 20  20 18   Temp: 98 F (36.7 C)  97.7 F (36.5 C) 98 F (36.7 C)  TempSrc: Oral  Oral Oral  SpO2: 95%  96% 100%  Weight:      Height:        General: Pt is alert, awake, not in acute distress Cardiovascular: S1/S2 +, no rubs, no gallops Respiratory: CTA bilaterally, no wheezing, no rhonchi Abdominal: Soft, NT, ND, bowel sounds + Extremities:  no cyanosis    The results of significant diagnostics from this hospitalization (including imaging, microbiology, ancillary and laboratory) are listed below for reference.     Microbiology: No results found for this or any previous visit (from the past 240 hour(s)).   Labs: BNP (last 3 results) No results for input(s): "BNP" in the last 8760 hours. Basic Metabolic Panel: Recent Labs  Lab 01/09/23 1407 01/12/23 0926 01/15/23 0458 01/16/23 0539  NA 134* 135 134* 137  K 4.2 4.0 4.0 4.1  CL 93* 98 98 99  CO2 31 29 31 30   GLUCOSE 234* 217* 123* 92  BUN 24* 31* 26* 21  CREATININE 1.01 1.09 0.90 0.82  CALCIUM 9.4 8.9 8.6* 9.0   Liver Function Tests: No results  for input(s): "AST", "ALT", "ALKPHOS", "BILITOT", "PROT", "ALBUMIN" in the last 168 hours. No results for input(s): "LIPASE", "AMYLASE" in the last 168 hours. No results for input(s): "AMMONIA" in the last 168 hours. CBC: Recent Labs  Lab 01/12/23 0436 01/15/23 0458 01/16/23 0539  WBC 7.8 6.2 6.1  HGB 11.5* 11.2* 12.0*  HCT 31.5* 31.1* 34.1*  MCV  67.0* 68.5* 68.5*  PLT 158 156 161   Cardiac Enzymes: No results for input(s): "CKTOTAL", "CKMB", "CKMBINDEX", "TROPONINI" in the last 168 hours. BNP: Invalid input(s): "POCBNP" CBG: Recent Labs  Lab 01/15/23 1217 01/15/23 1642 01/15/23 2115 01/16/23 0805 01/16/23 1139  GLUCAP 237* 171* 290* 111* 267*   D-Dimer No results for input(s): "DDIMER" in the last 72 hours. Hgb A1c No results for input(s): "HGBA1C" in the last 72 hours. Lipid Profile No results for input(s): "CHOL", "HDL", "LDLCALC", "TRIG", "CHOLHDL", "LDLDIRECT" in the last 72 hours. Thyroid function studies No results for input(s): "TSH", "T4TOTAL", "T3FREE", "THYROIDAB" in the last 72 hours.  Invalid input(s): "FREET3" Anemia work up No results for input(s): "VITAMINB12", "FOLATE", "FERRITIN", "TIBC", "IRON", "RETICCTPCT" in the last 72 hours. Urinalysis    Component Value Date/Time   COLORURINE YELLOW (A) 10/03/2022 2039   APPEARANCEUR CLEAR (A) 10/03/2022 2039   LABSPEC 1.010 10/03/2022 2039   PHURINE 6.0 10/03/2022 2039   GLUCOSEU 50 (A) 10/03/2022 2039   HGBUR SMALL (A) 10/03/2022 2039   BILIRUBINUR NEGATIVE 10/03/2022 2039   KETONESUR NEGATIVE 10/03/2022 2039   PROTEINUR 30 (A) 10/03/2022 2039   NITRITE NEGATIVE 10/03/2022 2039   LEUKOCYTESUR NEGATIVE 10/03/2022 2039   Sepsis Labs Recent Labs  Lab 01/12/23 0436 01/15/23 0458 01/16/23 0539  WBC 7.8 6.2 6.1   Microbiology No results found for this or any previous visit (from the past 240 hour(s)).   Time coordinating discharge: Over 30 minutes  SIGNED:   Charise Killian, MD  [facility] 01/16/2023, 1:09 PM Pager   If 7PM-7AM, please contact night-coverage [email or web address]

## 2023-01-16 NOTE — Progress Notes (Signed)
Occupational Therapy Treatment Patient Details Name: Eric Forbes MRN: [id number] DOB: 1955-09-25 Today's Date: 01/16/2023   History of present illness Pt is a 67 y/o M admitted on 12/27/22 after presenting with c/o pain & redness in LLE. Pt is being treated for LLE cellulitis. PMH: a-fib on Eliquis, CVA, DM2, HTN, CAD, MI   OT comments  Chart reviewed to date, OT entered room and pt requested to urgently utilize bathroom. Decreased safety of transfers on this date with pt requiring step by step multi modal cues for safe STS and short amb transfers. MOD A required for dressing, MAX A for thoroughness for toileting after attempt at [facility] on toilet. Pt will continue to benefit from ongoing OT to address functional deficits.       If plan is discharge home, recommend the following:  A lot of help with bathing/dressing/bathroom;A lot of help with walking and/or transfers   Equipment Recommendations  BSC/3in1;Tub/shower seat    Recommendations for Other Services      Precautions / Restrictions Precautions Precautions: Fall Precaution Comments: L hemi Restrictions Weight Bearing Restrictions: No       Mobility Bed Mobility               General bed mobility comments: NT in recliner, seated on edge of bed pre/post session    Transfers Overall transfer level: Needs assistance Equipment used: Rolling walker (2 wheels) Transfers: Sit to/from Stand Sit to Stand: Min assist                 Balance Overall balance assessment: Needs assistance Sitting-balance support: No upper extremity supported, Feet supported Sitting balance-Leahy Scale: Good     Standing balance support: Bilateral upper extremity supported, During functional activity, Reliant on assistive device for balance Standing balance-Leahy Scale: Poor                             ADL either performed or assessed with clinical judgement   ADL Overall ADL's : Needs assistance/impaired      Grooming: Sitting;Set up       Lower Body Bathing: Maximal assistance;Sit to/from stand   Upper Body Dressing : Minimal assistance;Sitting Upper Body Dressing Details (indicate cue type and reason): hemi dressing technique Lower Body Dressing: Moderate assistance;Sitting/lateral leans Lower Body Dressing Details (indicate cue type and reason): doff underwear/pants, donn underwear/pants with reacher, frequent vcs for technique Toilet Transfer: Minimal assistance;Moderate assistance;Rolling walker (2 wheels);BSC/3in1;Ambulation Toilet Transfer Details (indicate cue type and reason): short amb transfer, generally unsafe technique on this date requiring step by step multi modal cues Toileting- Clothing Manipulation and Hygiene: Maximal assistance;Sit to/from stand Toileting - Clothing Manipulation Details (indicate cue type and reason): for thoroughness after BM, MIN A for use of urinal in standing     Functional mobility during ADLs: Minimal assistance;Cueing for safety;Cueing for sequencing;Rolling walker (2 wheels);Moderate assistance (decreased safety of technique on this date) General ADL Comments: pt requires frequent vcs for safe transfer/mobilization technique    Extremity/Trunk Assessment              Vision       Perception     Praxis      Cognition Arousal: Alert Behavior During Therapy: WFL for tasks assessed/performed, Impulsive Overall Cognitive Status: No family/caregiver present to determine baseline cognitive functioning Area of Impairment: Safety/judgement, Awareness, Problem solving, Following commands  Following Commands: Follows one step commands with increased time Safety/Judgement: Decreased awareness of deficits Awareness: Emergent Problem Solving: Requires verbal cues, Requires tactile cues          Exercises      Shoulder Instructions       General Comments vss throughout    Pertinent Vitals/ Pain        Pain Assessment Pain Assessment: 0-10 Pain Score: 6  Pain Location: R shoulder Pain Descriptors / Indicators: Aching Pain Intervention(s): Limited activity within patient's tolerance, Monitored during session, Patient requesting pain meds-RN notified  Home Living                                          Prior Functioning/Environment              Frequency  Min 1X/week        Progress Toward Goals  OT Goals(current goals can now be found in the care plan section)  Progress towards OT goals: Progressing toward goals     Plan      Co-evaluation                AM-PAC OT "6 Clicks" Daily Activity     Outcome Measure   Help from another person eating meals?: None Help from another person taking care of personal grooming?: A Little Help from another person toileting, which includes using toliet, bedpan, or urinal?: A Little Help from another person bathing (including washing, rinsing, drying)?: A Lot Help from another person to put on and taking off regular upper body clothing?: A Little Help from another person to put on and taking off regular lower body clothing?: A Lot 6 Click Score: 17    End of Session Equipment Utilized During Treatment: Rolling walker (2 wheels)  OT Visit Diagnosis: Other abnormalities of gait and mobility (R26.89);Muscle weakness (generalized) (M62.81)   Activity Tolerance Patient tolerated treatment well   Patient Left with call bell/phone within reach;with bed alarm set (on edge of bed)   Nurse Communication  (mobility techs re: pt status)        Time: 2956-2130 OT Time Calculation (min): 30 min  Charges: OT General Charges $OT Visit: 1 Visit OT Treatments $Self Care/Home Management : 23-37 mins  Oleta Mouse, OTD OTR/L  01/16/23, 3:14 PM

## 2023-01-20 NOTE — Group Note (Deleted)

## 2024-04-28 NOTE — Discharge Summary (Addendum)
 [facility]  [facility] Inpatient Discharge Summary  PCP: Hunter DELENA Reef, [place] Discharge Details   Admit date:         04/05/2024 Discharge date:        04/28/2024  Hospital Days:    23 days  Code Status:   Full Code Advanced Directives on file: No Directive        Discharge Diagnoses:  Principal Problem:   Cardiac arrest (*) Active Problems:   Essential hypertension   Anxiety and depression   PAF (paroxysmal atrial fibrillation) (*)   Type 2 diabetes mellitus without complication, with long-term current use of insulin  (*)   Left leg cellulitis   Bilateral leg edema   Dyslipidemia   History of peripheral neuropathy   Acute muscle weakness   Microcytosis   Severe obesity (BMI 35.0-39.9) with comorbidity (*)   Dyspnea   Acute metabolic encephalopathy   Task list for follow-up:  Follow-up with PCP outpatient, follow-up with cardiology outpatient. Follow-up with urology outpatient.  [facility], further outpatient follow-ups to be recommended on discharge from LTAC.   Follow-Up Appointments Suggested: [facility] [address] [place]  [address] [phone]    Follow-Up Appointments Already Scheduled: No future appointments.  Discharge Medications:   Current Discharge Medication List     START taking these medications      Details  ALPRAZolam 0.25 mg tablet Commonly known as: XANAX  Take one tablet (0.25 mg dose) by mouth 3 (three) times a day as needed for up to 5 days. Max Daily Amount: 0.75 mg Quantity: 15 tablet   amLODIPine  besylate 10 mg tablet Commonly known as: NORVASC  Start taking on: April 29, 2024  Take one tablet (10 mg dose) by mouth daily. Quantity: 30 tablet   hydrALAZINE HCl 25 mg tablet Commonly known as: APRESOLINE  Take one tablet (25 mg dose) by mouth every 8 (eight) hours. Quantity: 120 tablet   insulin   glargine 100 units/mL injection Commonly known as: LANTUS  SOLOSTAR Replaces: insulin  glargine 100 Unit/mL injection  Inject ten Units into the skin at bedtime. Quantity: 5 each   lactulose  10 g/15 mL solution Commonly known as: LACTULOSE ,GENERLAC   Take 30 mLs (20 g dose) by mouth 2 (two) times daily for 10 days. Quantity: 240 mL   lidocaine 4% patch 4 % Start taking on: April 29, 2024  Place one patch onto the skin daily. Remove & Discard patch within 12 hours or as directed by MD Quantity: 30 patch   oxyCODONE  HCl 5 mg immediate release tablet Commonly known as: ROXICODONE   Take one tablet (5 mg dose) by mouth every 4 (four) hours as needed for up to 5 days. Max Daily Amount: 30 mg Quantity: 30 tablet       CONTINUE these medications which have CHANGED      Details  acetaminophen  500 mg tablet Commonly known as: TYLENOL  What changed:  how much to take when to take this reasons to take this Another medication with the same name was removed. Continue taking this medication, and follow the directions you see here.  Take two tablets (1,000 mg dose) by mouth every 8 (eight) hours for 7 days. Quantity: 30 tablet   metoprolol succinate 100 mg 24 hr tablet Commonly known as: TOPROL-XL What changed: how much to take  Take one and a half tablets (150 mg dose) by mouth daily. Quantity: 30 tablet       CONTINUE  these medications which have NOT CHANGED      Details  albuterol  sulfate HFA 108 (90 Base) MCG/ACT inhaler Commonly known as: PROVENTIL ,VENTOLIN ,PROAIR   Inhale two puffs into the lungs every 4 (four) hours as needed.   AQUAPHOR EX  Apply 1 Application topically daily as needed.   busPIRone  10 mg tablet Commonly known as: BUSPAR   Take one tablet (10 mg dose) by mouth 3 (three) times a day.   carboxymethylcellulose 0.5% Soln Commonly known as: REFRESH PLUS  Place two drops into both eyes 2 (two) times daily.   clopidogrel  bisulfate 75 mg tablet Commonly  known as: PLAVIX   Take one tablet (75 mg dose) by mouth daily.   cyclobenzaprine  10 mg tablet Commonly known as: FLEXERIL   Take one tablet (10 mg dose) by mouth daily as needed for Muscle spasms.   diclofenac  sodium 1 % gel Commonly known as: VOLTAREN   Apply two g topically 4 (four) times a day as needed.   docusate sodium capsule Commonly known as: COLACE,DOK,DOCQLACE  Take one capsule (100 mg dose) by mouth 2 (two) times daily.   DULoxetine  HCl 30 mg capsule Commonly known as: CYMBALTA   Take three capsules (90 mg dose) by mouth daily.   ferrous sulfate 325 (65 FE) MG tablet  Take one tablet (325 mg dose) by mouth 1 (one) time each day before breakfast.   glucose 4 g chewable tablet  Chew four tablets (16 g dose) by mouth as needed for Low blood sugar.   lisinopril  20 mg tablet Commonly known as: PRINIVIL ,ZESTRIL   Take one tablet (20 mg dose) by mouth daily.   metFORMIN 500 mg tablet Commonly known as: GLUCOPHAGE  Take one tablet (500 mg dose) by mouth 2 (two) times daily with meals.   mupirocin 2 % ointment Commonly known as: BACTROBAN  Apply one Application topically 2 (two) times daily.   naloxone nasal spray (TAKE HOME PACK) Commonly known as: NARCAN  one spray by Nasal route once.   neomycin-bacitracin-polymyxin ointment  Apply 1 Application topically daily.   pregabalin  50 mg capsule Commonly known as: LYRICA   Take one capsule (50 mg dose) by mouth 2 (two) times daily. Max Daily Amount: 100 mg Quantity: 90 capsule   rosuvastatin  calcium  40 mg tablet Commonly known as: CRESTOR   Take one tablet (40 mg dose) by mouth daily. for cholesterol      * You might also be taking other medications not listed above. If you have questions about any of your other medications, talk to the person who prescribed them or your Primary Care Provider.          STOP taking these medications    BETAPACE  80 mg tablet Generic drug: sotalol    famotidine 20 mg  tablet Commonly known as: PEPCID   furosemide  20 mg tablet Commonly known as: LASIX    HYDROcodone -acetaminophen  5-325 mg per tablet Commonly known as: NORCO   hydrOXYzine  HCl 25 mg tablet Commonly known as: ATARAX    insulin  aspart 100 UNIT/ML injection Commonly known as: NOVOLOG    insulin  glargine 100 Unit/mL injection Commonly known as: LANTUS  Replaced by: insulin  glargine 100 units/mL injection   isosorbide  mononitrate 60 mg 24 hr tablet Commonly known as: IMDUR    melatonin 3 mg Tabs   tamsulosin  0.4 mg Caps Commonly known as: FLOMAX         Allergies: Allergies[1]  Consultations this Admission: INPATIENT CONSULT TO WOUND OSTOMY IP CONSULT TO CASE MANAGEMENT, RN/SW IP CONSULT TO INFECTIOUS DISEASES INPATIENT CONSULT TO WOUND OSTOMY IP CONSULT  TO PSYCHIATRY / BH IP CONSULT TO PALLIATIVE CARE IP CONSULT TO PULMONOLOGY IP CONSULT TO CASE MANAGEMENT, RN/SW IP CONSULT TO CARDIOLOGY  Procedures/Imaging:  12/15 1449 Speech Modified Barium Swallow (MBS):, HC SP MOTN FLUORO SWAL EVAL MBSS  Echocardiogram Limited WO Enhancing Agent  Final Result  Left Ventricle: EF: 55-60%. Wall motion is normal.    Mitral Valve: Mitral valve structure is normal.    Left Atrium: Left atrium size is normal.    Aorta: The aortic root is normal in size.    IVC/SVC: The inferior vena cava demonstrates a diameter of >2.1 cm and   collapses <50%; therefore, the right atrial pressure is estimated at 15   mmHg.      XR Chest Ap Portable  Final Result  IMPRESSION:    Venous congestion. Layering right pleural effusion increased from 04/19/2024, visible on more recent CT.    Venous congestion.    Electronically Signed by: Adine Fleeta Bang on 04/25/2024 11:41 AM    CT Angio Chest Pulmonary  Final Result  IMPRESSION:  1.  No pulmonary embolism.  2.  Bilateral pleural effusions and atelectasis.                Electronically Signed by: Sonny Appl, MD on 04/23/2024 4:15 PM     CT Abdomen Pelvis W IV Contrast  Final Result  IMPRESSION:    1. Bilateral PE in the lower lobes. Within the field-of-view.  2.  There are multiple costal cartilage fractures with some hematoma in the anterior abdominal wall and lower chest wall.  3.  Bilateral pleural effusions with atelectasis.  4.  Bilateral perinephric stranding extends into the paracolic gutters and pelvis. Left renal cyst.  5.  Decreased distention of the colon.  6.  Mild prominence of the haustral folds and colonic walls, due in part to submaximal distention.  7.  Penile prosthesis. Bilateral hip replacements. Mid lumbar DDD with significant stenoses.              ###CODE CRITICAL REPORT###  (automated ordering provider notification pat12/04/2024 2:40 PM12/04/2024 2:40 PM)   #1 and #2 above.    Electronically Signed by: Franky Cheshire MD, PHD on 04/23/2024 2:47 PM    XR Abdomen Portable  Final Result  IMPRESSION:  Nonspecific, nonobstructive abdomen demonstrating gaseous distention of the colon.    Electronically Signed by: Sonny Livings, MD on 04/21/2024 5:41 AM    CT Abdomen Pelvis W PO Contrast Only  Final Result  IMPRESSION:  1. Dilated large bowel with associated air-fluid levels and superior mesenteric artery distribution, etiology uncertain. Differential diagnosis includes typhlitis, inflammatory bowel disease and ischemic colitis.  2. Near total resolution bibasilar consolidative changes.  3. Bilateral residual small pleural effusions.  4. Severe degenerative disc disease L2-3.  5. Atherosclerosis.      Electronically Signed by: Curly Barrio on 04/20/2024 2:30 PM    XR Chest Ap Portable  Final Result  IMPRESSION:    Endotracheal tube at T3-4.    Nasogastric tube descends past the diaphragm its tip comminuted off the film.    Bilateral infiltrates similar to 04/18/2024.    Electronically Signed by: Marinda Fleming, MD on 04/20/2024 12:17 AM    XR Chest Ap Portable  Final Result   IMPRESSION:    Mild edema.    Electronically Signed by: Reyes Luna, MD on 04/18/2024 1:49 PM    XR Chest Ap Portable  Final Result  IMPRESSION:    Slight improvement in mild edema.  Electronically Signed by: Reyes Luna, MD on 04/17/2024 1:16 PM    XR Abdomen Portable  Final Result  IMPRESSION:    Nonobstructed bowel gas pattern.    Electronically Signed by: Reyes Luna, MD on 04/17/2024 1:15 PM    Echocardiogram Complete WO Enhancing Agent  Final Result  Left Ventricle: Systolic function is normal. EF: 60-65%.    Left Atrium: Left atrium is severely dilated.  at 5.700 cm. Left atrium   volume index is normal (16-34 mL/m2).    Aortic Valve: Mild aortic valve regurgitation.    Mitral Valve: There is mild regurgitation.    Tricuspid Valve: There is moderate regurgitation. The right ventricular   systolic pressure is moderately elevated (50-59 mmHg).      CT Head WO Contrast  Final Result  IMPRESSION:  1.  No acute intracranial abnormality.  2.  Chronic findings as above.        Electronically Signed by: Dallas Jubilee, MD on 04/15/2024 8:58 AM    CT Chest Abdomen Pelvis WO IV Contrast  Final Result  IMPRESSION: New bilateral lower lobe consolidation and increased mild left upper lobe opacities compatible with pneumonia. Additional comments are as above.        Please note that any pulmonary follow up recommendations are in accordance with the Fleischner Society 2017 guidelines, unless otherwise stated.    Electronically Signed by: Lynwood Portugal on 04/15/2024 9:03 AM    XR Chest Ap Portable  Final Result  Impression: Endotracheal tube is appropriately positioned.    Electronically Signed by: Lynwood Portugal on 04/15/2024 7:32 AM    XR Chest Ap Portable  Final Result  IMPRESSION:   XR CHEST   Cardiomegaly with mild pulmonary venous congestion.  Right mainstem intubation.  This is corrected on follow-up chest radiograph.  Mild bilateral basilar  densities are present, likely secondary to subsegmental atelectasis and less likely developing infiltrates.        Electronically Signed by: Charlie Patch, MD on 04/15/2024 5:51 AM    XR Abdomen Portable  Final Result  IMPRESSION:  Satisfactory placement of NG tube.        Electronically Signed by: Charlie Patch, MD on 04/15/2024 5:46 AM    US  Venous Upper Extremity Left  Final Result  IMPRESSION:  No evidence of deep venous thrombosis.    Electronically Signed by: Glendia Guillaume, MD on 04/10/2024 12:58 PM    CT Head WO Contrast  Final Result  IMPRESSION:  1.  No acute intracranial abnormality. MRI of the brain is more sensitive and specific for the evaluation of acute ischemia and may be considered for further evaluation if clinically warranted/if clinical suspicion persists.  2.  Chronic findings as above.      Electronically Signed by: Valma Companion, MD on 04/09/2024 10:59 AM    XR Abdomen Ap  Final Result  IMPRESSION:  1. Moderate constipation    Electronically Signed by: Glendia Guillaume, MD on 04/08/2024 3:48 PM    CT Angio Chest Pulmonary  Final Result  IMPRESSION: No evidence of pulmonary embolus. There are mild left upper lobe and left lower lobe groundglass opacities which may represent pneumonia        Please note that any pulmonary nodule follow up recommendations are in accordance with the Fleischner Society 2017 guidelines, unless otherwise stated.    Electronically Signed by: Lynwood Portugal on 04/06/2024 3:33 PM    XR Chest Ap Portable  Final Result  Impression: No acute findings.    Electronically Signed  by: Lynwood Portugal on 04/06/2024 10:21 AM    XR Tibia And Fibula 2 Views Right  Final Result  IMPRESSION:   No acute osseous findings.     Electronically Signed by: Dorn Burkes on 04/05/2024 7:05 PM    US  Venous Lower Extremity Left  Final Result  IMPRESSION:  No evidence of lower extremity deep venous thrombosis.    Electronically  Signed by: Dorn Burkes on 04/05/2024 6:40 PM    CT Tibia Fibula Left  W Contrast  Final Result  IMPRESSION:   1.  Extensive edema of the leg and foot which may be diabetic/neuropathic or vascular/lymphatic in nature.  2.  Areas of blistering of the anterior distal leg and dorsal medial and dorsal lateral forefoot possibly due to focal areas of edema or less likely focal infection.  3.  Mild thickening and enhancement of the distal leg suggesting superimposed mild infectious or inflammatory cellulitis without abscess.  4.  No CT evidence of osteomyelitis or septic arthritis.    Interpretation provided by a fellowship-trained musculoskeletal radiologist.        Electronically Signed by: Belvie Bath, MD on 04/05/2024 9:19 PM    CT Foot Left  W Contrast  Final Result  IMPRESSION:   1.  Extensive edema of the leg and foot which may be diabetic/neuropathic or vascular/lymphatic in nature.  2.  Areas of blistering of the anterior distal leg and dorsal medial and dorsal lateral forefoot possibly due to focal areas of edema or less likely focal infection.  3.  Mild thickening and enhancement of the distal leg suggesting superimposed mild infectious or inflammatory cellulitis without abscess.  4.  No CT evidence of osteomyelitis or septic arthritis.    Interpretation provided by a fellowship-trained musculoskeletal radiologist.        Electronically Signed by: Belvie Bath, MD on 04/05/2024 9:19 PM    XR Chest Ap Portable  Final Result  IMPRESSION: No active disease.    Electronically Signed by: Reyes People, MD on 04/05/2024 2:12 PM    FL Esophagus Modified Barium Swallow    (Results Pending)    Pertinent Labs:  Cardiac Labs: Recent Labs    Units 04/26/24 0155 04/25/24 0328 04/24/24 0308  BNP pg/mL 3,002* 2,829* 2,302*   CBC: Recent Labs    Units 04/28/24 0439 04/27/24 0850 04/26/24 0155 04/25/24 0328 04/24/24 0308 04/23/24 0148 04/22/24 0232  WBC  thou/mcL 11.5* 12.4* 7.9 7.6 9.0 9.4 11.0*  HGB gm/dL 88.6* 88.2* 89.9* 9.2* 9.3* 9.2* 9.7*  PLT thou/mcL 187 200 158 145* 131* 117* 123*   BMP: Recent Labs    Units 04/28/24 0439 04/27/24 0850 04/26/24 0155 04/25/24 0328 04/24/24 0308 04/23/24 0946  NA mmol/L 141 141 139 141 141 145  K mmol/L 3.1* 3.4* 3.9 3.7 3.5* 3.8  CL mmol/L 99 100 103 105 106 109*  CO2 mmol/L 28 25 27 29 27 26   BUN mg/dL 9 11 14 14 13 17   CREATININE mg/dL 9.23 9.15 8.95 8.89 9.07 0.99  MAGNESIUM mg/dL 1.7 1.5* 1.6 1.8 1.8  --    Lipid Panel: No results for input(s): CHOL, TRIG, HDL, LDL in the last 168 hours. Liver Enzymes: Recent Labs    Units 04/26/24 0155  INR  1.4   Endocrine Panels: Recent Labs    Units 04/28/24 0821 04/28/24 0439 04/27/24 2147 04/27/24 1708 04/27/24 1155 04/27/24 0850 04/27/24 0809 04/26/24 2009 04/26/24 1702 04/26/24 1209 04/26/24 0847 04/26/24 0155  GLUCOSE mg/dL 791* 798* 831* 816* 784* 216*  187* 144* 140* 158* 171* 145[facility]   Physicians involved in care during this hospitalization Attending Provider: Yancy Sorrel, MD Attending Provider: Orlene Karst, MD Attending Provider: Margaretmary DELENA Haley, MD Attending Provider: Nancyann Ozell Duet, MD Attending Provider: Ricka JINNY Siren, MD Attending Provider: Deedee Flatten, MD Attending Provider: Orlene Karst, MD Admitting Provider: Yancy Sorrel, MD Consulting Physician: Yancy Sorrel, MD Consulting Physician: Orlene Karst, MD Consulting Physician: Cathlean FORBES Lanius, MD Consulting Physician: Ozell KANDICE Sat, MD Consulting Physician: Athena Consult To Psychiatry Consulting Physician: Amy Almarie Spates, PMHNP Consulting Physician: [facility] Consult To [facility] Consulting Physician: Nancyann Ozell Duet, MD Consulting Physician: Christell VEAR Mater, MD Consulting Physician: Redell DELENA Montenegro, MD  HPI per admitting provider:  Patient was admitted on 11/24 as follows  >>   Patient is a 68 year old male, with a PMH of Insulin  Dependent Dm type 2, Essential HTN, HLD,PAF, BPH, Peripheral Neuropathy, Anxiety and Depression, who presents to the hospital for left leg pain and weakness. Patient was very poor historian. Patient developed left leg pain and weakness recently, On 04-05-2024, patient had difficulty walking prompting family to seek medical care care.   Hospital Course:   He was initially admitted with concern for left leg cellulitis, lower extremity edema, dyspnea with history of atrial fibrillation, DM, hypertension, hyperlipidemia, peripheral neuropathy.  His hospital course was complicated by respiratory failure requiring intubation likely secondary to aspiration pneumonia, cardiac arrest requiring CPR and ICU stay.   He was managed as follows >>   Acute metabolic encephalopathy Anxiety and depression Agitation   Xanax 0.25 mg 3 times daily as needed made available Currently has been off Precedex drip Continue BuSpar , Cymbalta  Continue to monitor mentation Continue monitoring at [facility]   Status post-cardiac arrest Rib fractures and hematoma in the anterior abdominal wall and lower chest wall Incentive spirometry Lidocaine patch for chest wall pain.   Scheduled Tylenol  1 g every 8 hours. P.o. oxycodone  as needed for severe pain and low-dose IV Dilaudid  for breakthrough pain while inpatient Palliative care has been following while inpatient  Speech and dietitian on board.   Diabetic diet currently. MBSS result >> Mild oropharyngeal dysphagia, likely acute during an admission for cardiac arrest. Swallow safety and efficiency are intact. No aspiration across consistencies. Transient penetration observed with thin liquids/0 above the vocal folds and ejected spontaneously. SLP recommends a diet of thin liquids/0 and regular solids/7 as safest and least restrictive diet at this time. Other recommendations include ENT referral for changes in voice  quality and suspected edema in pharyngeal structures. SLP to continue to follow for potential cognitive evaluation and diet tolerance assessment.   Bilateral PNA Bilateral pleural effusions and atelectasis Elevated BNP BNP elevated and trending up on 12/15.  Repeat x-ray showed venous congestion, increased right pleural effusion.  Received IV Lasix  in 12/15. Monitor input output and consider IV diuresis at the LTAC on as needed basis. Continue to monitor in and out.  Limited echo showed LV ejection fraction 55 to 60%, normal wall motion. Wean oxygen as tolerated.  Incentive spirometry.   Patient has received IV cefepime, vancomycin  since 12/4 till 1211.  Leukocytosis resolved.   Hypokalemia, hypomagnesemia hypophosphatemia>> supplemental potassium and magnesium and phosphorus ordered on discharge,  continue to monitor electrolytes and replace as needed.   Malfunction of penile implant Previous provider has discussed with urologist Dr. Vicci who recommended to encourage patient deflate himself and if he is not able to void recommend to  watch for urination.  Recommended straight cath rather than leaving the Foley if needed.  If he cannot void next time and will need a Foley, urology will come and deflate the device while inpatient.  Has been having good urine output as reported. To follow-up with urology outpatient. Discussed with RN at bedside this morning, no problems with urine output at this time.   Pneumatosis  IV metronidazole started on 12/9.  Stopped on 12/15.   General Surgery evaluated. Repeat CT abdomen showed decreased distention of colon.  Accidentally showed ? bilateral PE in lower lobes which were not seen on the repeat CTA chest later. To continue to monitor at the [facility] hospital for any changes in status and any further workup if needed.   Dyslipidemia On Crestor    History of peripheral neuropathy On Duloxetine  and Lyrica    Acute muscle weakness PT consulted    Severe obesity (BMI 35.0-39.9) with comorbidity  Affects all aspects of care.  Will need outpatient management with primary care doctor.   Essential hypertension PAF (paroxysmal atrial fibrillation)  Watchman device in place Off of amiodarone drip.  On metoprolol. Lovenox  daily for prophylaxis. Cardiology consult placed for evaluation and recommendations >> appreciate evaluation and recommendations, recommended to increase dose of metoprolol, off sotalol .   Type 2 diabetes mellitus without complication, with long-term current use of insulin  Continue sliding scales and continue to monitor glucose.  Hypoglycemia protocol. Will add low-dose Lantus  with hyperglycemia >> continue to monitor blood sugar at LTAC and adjust regimen as needed.     Uncontrolled hypertension >> improved today - Received IV hydralazine - Continue IV hydralazine/IV labetalol  as needed - P.o. amlodipine  and p.o. hydralazine initiated as well >> dose of p.o. amlodipine  increased on 12/17 - Continue to monitor blood pressure and adjust regimen as needed  Initially admitted with lower extremity cellulitis >> continue wound care at the [facility] hospital on discharge.   I saw and assessed the patient at bedside this morning on 12/17. He was laying on bed, awake and alert, not in acute distress. No significant acute issues reported to me. His blood pressure is better. Discussed with bedside RN. He is being discharged to [facility] hospital today. Continue further management at [facility] hospital. Respective follow-up with the specialists following LTAC discharge to be managed per LTAC team.  BP (!) 155/70   Pulse 93   Temp 97.6 F (36.4 C) (Axillary)   Resp 22   Ht 1.778 m (5' 10)   Wt 114 kg (251 lb 5.2 oz)   SpO2 91%   BMI 36.06 kg/m     Post Hospital Care   Activity:   Weight Bearing Status:          Oxygen Orders for Discharge: O2 Device: None (Room air) SpO2: 91 %  Diet: Diet and Nourishment Orders  (From admission, onward)     Start       04/26/24 1700  Dietary nutrition supplements Glucerna  With Meals       Question:  Select Supplement  Answer:  Glucerna     04/25/24 0746  Consistent Carbohydrate Carb Consistent  Diet effective now       Question:  Additional restrictions:  Answer:  Carb Consistent              Wound Care Recommendations:  Wound Orders (From admission, onward)     Start       04/08/24 0900  WOUND CARE     Comments: Right lower leg, left  foot and left lower leg: Clean area with moisturizing cleanser spray, apply  clear moisture barrier protective ointment liberally to intact skin and wound bed. Cover blister and open areas with Vaseline gauze and ABD pad. Wrap from toe to knee with Kerlix and Ace Wrap.  CHANGE DAILY AND PRN SOILING  Order Comments: Right lower leg, left foot and left lower leg: Clean area with moisturizing cleanser spray, apply  clear moisture barrier protective ointment liberally to intact skin and wound bed. Cover blister and open areas with Vaseline gauze and ABD pad. Wrap from toe to knee with Kerlix and Ace Wrap.  CHANGE DAILY AND PRN SOILING        04/07/24 2100  WOUND CARE     Comments: Reconsult wound/ostomy should appearance of wound differ from previous wound care assessment on 04/07/2024.  Order Comments: Reconsult wound/ostomy should appearance of wound differ from previous wound care assessment on 04/07/2024.        04/06/24 1321  OPEN BLISTER(S)     Comments: Location: L+R lower legs, Clean area with sodium chloride  0.9%.  Apply Vaseline gauze.  Cover with dry dressing.  Wrap/secure as needed.  Change dressing daily and as needed.  Consult Wound Ostomy Services [facility]) for treatment regimen as indicated / needed.  Order Comments: Location: L+R lower legs, Clean area with sodium chloride  0.9%.  Apply Vaseline gauze.  Cover with dry dressing.  Wrap/secure as needed.  Change dressing daily and as needed.  Consult Wound Ostomy Services  [facility]) for treatment regimen as indicated / needed.     Question:  Screening order?  Answer:  Yes              Lines/Drains/Airways: Patient Lines/Drains/Airways Status     Active LDAs     Name Placement date Placement time Site Days   Peripheral IV 20 G Anterior;Proximal;Right Forearm 04/15/24  0430  Forearm  13   Peripheral IV 20 G Anterior;Left;Proximal Forearm 04/15/24  0430  Forearm  13   Male External Urinary Catheter 04/27/24  --  --  1            Therapy Recommendations:  PT: Anticipated PT needs at discharge: Moderate intensity therapy - less than 3 hours of therapy per day Anticipated Caregiver Needs at Discharge: Total physical assistance, patient dependent, Physical hands-on assistance DME Equipment Recommendations: Other (comment) (defer to next LOC)     AM-PAC Basic Mobility Raw Score (out of 24): 8 Routine Mobility Goal: Sit At edge of Bed & Wash Face, Comb Hair, Shave, Brush Teeth 3  OT: Anticipated OT needs at discharge: Moderate intensity therapy - less than 3 hours of therapy per day Anticipated caregiver needs at discharge: Total physical assistance, patient dependent, Physical hands-on assistance   DME Equipment Recommendations: No DME needs anticipated for ADLs  SLP: Anticipated SLP Needs at Discharge: Continued SLP at next level of care Anticipated Caregiver Needs at Discharge: Not formally addressed   Nutritional Recommendation: Thin Liquids/0, Regular diet Recommended Form of Meds: Crushed, With puree, Whole, As tolerated Compensatory Swallowing Strategies: Feed only when fully awake/alert, Remain upright 20-30 min after eating, Upright as possible for oral intake, Small, single bites/sips, Multiple swallows per bite/sip, Alternate foods and liquids, Eat/feed slowly  Home Health Orders: DME Orders (From admission, onward)    None      Home Health Agency     None       I spent 90 minutes performing discharge services.    Electronically signed: Subarna  Emaline, MD 04/28/2024 / 11:58 AM       [1] Allergies Allergen Reactions   Venom-Honey Bee [Bee Venom] Swelling  *Some images could not be shown.

## 2024-05-03 DIAGNOSIS — I10 Essential (primary) hypertension: Secondary | ICD-10-CM | POA: Diagnosis not present

## 2024-05-03 DIAGNOSIS — E119 Type 2 diabetes mellitus without complications: Secondary | ICD-10-CM | POA: Diagnosis not present

## 2024-05-03 DIAGNOSIS — L03116 Cellulitis of left lower limb: Secondary | ICD-10-CM | POA: Diagnosis not present

## 2024-05-04 DIAGNOSIS — E119 Type 2 diabetes mellitus without complications: Secondary | ICD-10-CM | POA: Diagnosis not present

## 2024-05-04 DIAGNOSIS — I10 Essential (primary) hypertension: Secondary | ICD-10-CM | POA: Diagnosis not present

## 2024-05-04 DIAGNOSIS — L03116 Cellulitis of left lower limb: Secondary | ICD-10-CM | POA: Diagnosis not present

## 2024-05-05 DIAGNOSIS — L03116 Cellulitis of left lower limb: Secondary | ICD-10-CM | POA: Diagnosis not present

## 2024-05-05 DIAGNOSIS — I10 Essential (primary) hypertension: Secondary | ICD-10-CM | POA: Diagnosis not present

## 2024-05-05 DIAGNOSIS — E119 Type 2 diabetes mellitus without complications: Secondary | ICD-10-CM | POA: Diagnosis not present

## 2024-05-06 DIAGNOSIS — I10 Essential (primary) hypertension: Secondary | ICD-10-CM | POA: Diagnosis not present

## 2024-05-06 DIAGNOSIS — E119 Type 2 diabetes mellitus without complications: Secondary | ICD-10-CM | POA: Diagnosis not present

## 2024-05-06 DIAGNOSIS — L03116 Cellulitis of left lower limb: Secondary | ICD-10-CM | POA: Diagnosis not present

## 2024-05-07 DIAGNOSIS — I10 Essential (primary) hypertension: Secondary | ICD-10-CM | POA: Diagnosis not present

## 2024-05-07 DIAGNOSIS — E119 Type 2 diabetes mellitus without complications: Secondary | ICD-10-CM | POA: Diagnosis not present

## 2024-05-07 DIAGNOSIS — L03116 Cellulitis of left lower limb: Secondary | ICD-10-CM | POA: Diagnosis not present

## 2024-05-08 DIAGNOSIS — I10 Essential (primary) hypertension: Secondary | ICD-10-CM | POA: Diagnosis not present

## 2024-05-08 DIAGNOSIS — E119 Type 2 diabetes mellitus without complications: Secondary | ICD-10-CM | POA: Diagnosis not present

## 2024-05-08 DIAGNOSIS — L03116 Cellulitis of left lower limb: Secondary | ICD-10-CM | POA: Diagnosis not present

## 2024-05-09 DIAGNOSIS — E119 Type 2 diabetes mellitus without complications: Secondary | ICD-10-CM | POA: Diagnosis not present

## 2024-05-09 DIAGNOSIS — L03116 Cellulitis of left lower limb: Secondary | ICD-10-CM | POA: Diagnosis not present

## 2024-05-09 DIAGNOSIS — I10 Essential (primary) hypertension: Secondary | ICD-10-CM | POA: Diagnosis not present

## 2024-05-10 DIAGNOSIS — L03116 Cellulitis of left lower limb: Secondary | ICD-10-CM | POA: Diagnosis not present

## 2024-05-10 DIAGNOSIS — E119 Type 2 diabetes mellitus without complications: Secondary | ICD-10-CM | POA: Diagnosis not present

## 2024-05-10 DIAGNOSIS — I10 Essential (primary) hypertension: Secondary | ICD-10-CM | POA: Diagnosis not present

## 2024-05-11 DIAGNOSIS — I10 Essential (primary) hypertension: Secondary | ICD-10-CM | POA: Diagnosis not present

## 2024-05-11 DIAGNOSIS — E119 Type 2 diabetes mellitus without complications: Secondary | ICD-10-CM | POA: Diagnosis not present

## 2024-05-11 DIAGNOSIS — L03116 Cellulitis of left lower limb: Secondary | ICD-10-CM | POA: Diagnosis not present
